# Patient Record
Sex: Female | Born: 1950 | Race: White | Hispanic: No | Marital: Married | Smoking: Current every day smoker
Health system: Southern US, Community
[De-identification: ages and names within clinical notes are randomized; demographics above are authoritative.]

## PROBLEM LIST (undated history)

## (undated) DIAGNOSIS — I10 Essential (primary) hypertension: Secondary | ICD-10-CM

## (undated) DIAGNOSIS — K5790 Diverticulosis of intestine, part unspecified, without perforation or abscess without bleeding: Secondary | ICD-10-CM

## (undated) DIAGNOSIS — R7303 Prediabetes: Secondary | ICD-10-CM

## (undated) DIAGNOSIS — Z86010 Personal history of colonic polyps: Secondary | ICD-10-CM

## (undated) DIAGNOSIS — Z780 Asymptomatic menopausal state: Secondary | ICD-10-CM

## (undated) DIAGNOSIS — M545 Low back pain, unspecified: Secondary | ICD-10-CM

## (undated) DIAGNOSIS — Z713 Dietary counseling and surveillance: Secondary | ICD-10-CM

## (undated) DIAGNOSIS — M8580 Other specified disorders of bone density and structure, unspecified site: Secondary | ICD-10-CM

## (undated) DIAGNOSIS — F17200 Nicotine dependence, unspecified, uncomplicated: Secondary | ICD-10-CM

## (undated) DIAGNOSIS — H409 Unspecified glaucoma: Secondary | ICD-10-CM

## (undated) DIAGNOSIS — K648 Other hemorrhoids: Secondary | ICD-10-CM

## (undated) DIAGNOSIS — E785 Hyperlipidemia, unspecified: Secondary | ICD-10-CM

## (undated) DIAGNOSIS — J449 Chronic obstructive pulmonary disease, unspecified: Secondary | ICD-10-CM

## (undated) HISTORY — DX: Unspecified glaucoma: H40.9

## (undated) HISTORY — DX: Other specified disorders of bone density and structure, unspecified site: M85.80

## (undated) HISTORY — DX: Low back pain: M54.5

## (undated) HISTORY — DX: Prediabetes: R73.03

## (undated) HISTORY — DX: Hyperlipidemia, unspecified: E78.5

## (undated) HISTORY — DX: Essential (primary) hypertension: I10

## (undated) HISTORY — DX: Other hemorrhoids: K64.8

## (undated) HISTORY — DX: Dietary counseling and surveillance: Z71.3

## (undated) HISTORY — DX: Low back pain, unspecified: M54.50

## (undated) HISTORY — DX: Asymptomatic menopausal state: Z78.0

## (undated) HISTORY — DX: Chronic obstructive pulmonary disease, unspecified: J44.9

## (undated) HISTORY — DX: Diverticulosis of intestine, part unspecified, without perforation or abscess without bleeding: K57.90

## (undated) HISTORY — DX: Personal history of colonic polyps: Z86.010

## (undated) HISTORY — DX: Nicotine dependence, unspecified, uncomplicated: F17.200

---
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
## 1978-09-21 HISTORY — PX: TUBAL LIGATION: SHX77

## 1995-09-22 DIAGNOSIS — E785 Hyperlipidemia, unspecified: Secondary | ICD-10-CM

## 1995-09-22 HISTORY — DX: Hyperlipidemia, unspecified: E78.5

## 1999-08-12 ENCOUNTER — Encounter: Payer: Self-pay | Admitting: Emergency Medicine

## 1999-08-12 ENCOUNTER — Encounter: Admission: RE | Admit: 1999-08-12 | Discharge: 1999-08-12 | Payer: Self-pay | Admitting: Emergency Medicine

## 1999-12-08 ENCOUNTER — Encounter: Payer: Self-pay | Admitting: Emergency Medicine

## 1999-12-08 ENCOUNTER — Encounter: Admission: RE | Admit: 1999-12-08 | Discharge: 1999-12-08 | Payer: Self-pay | Admitting: Emergency Medicine

## 2000-12-15 ENCOUNTER — Encounter: Payer: Self-pay | Admitting: Gynecology

## 2000-12-15 ENCOUNTER — Other Ambulatory Visit: Admission: RE | Admit: 2000-12-15 | Discharge: 2000-12-15 | Payer: Self-pay | Admitting: Gynecology

## 2000-12-15 ENCOUNTER — Encounter: Admission: RE | Admit: 2000-12-15 | Discharge: 2000-12-15 | Payer: Self-pay | Admitting: Gynecology

## 2000-12-29 ENCOUNTER — Encounter: Payer: Self-pay | Admitting: Gynecology

## 2000-12-29 ENCOUNTER — Encounter: Admission: RE | Admit: 2000-12-29 | Discharge: 2000-12-29 | Payer: Self-pay | Admitting: Gynecology

## 2001-09-30 ENCOUNTER — Encounter (INDEPENDENT_AMBULATORY_CARE_PROVIDER_SITE_OTHER): Payer: Self-pay | Admitting: Specialist

## 2001-09-30 ENCOUNTER — Ambulatory Visit (HOSPITAL_BASED_OUTPATIENT_CLINIC_OR_DEPARTMENT_OTHER): Admission: RE | Admit: 2001-09-30 | Discharge: 2001-09-30 | Payer: Self-pay | Admitting: Gynecology

## 2002-01-10 ENCOUNTER — Encounter: Payer: Self-pay | Admitting: Gynecology

## 2002-01-10 ENCOUNTER — Other Ambulatory Visit: Admission: RE | Admit: 2002-01-10 | Discharge: 2002-01-10 | Payer: Self-pay | Admitting: Gynecology

## 2002-01-10 ENCOUNTER — Encounter: Admission: RE | Admit: 2002-01-10 | Discharge: 2002-01-10 | Payer: Self-pay | Admitting: Gynecology

## 2003-01-15 ENCOUNTER — Other Ambulatory Visit: Admission: RE | Admit: 2003-01-15 | Discharge: 2003-01-15 | Payer: Self-pay | Admitting: Gynecology

## 2003-01-16 ENCOUNTER — Encounter: Payer: Self-pay | Admitting: Gynecology

## 2003-01-16 ENCOUNTER — Encounter: Admission: RE | Admit: 2003-01-16 | Discharge: 2003-01-16 | Payer: Self-pay | Admitting: Gynecology

## 2003-01-23 LAB — HM DEXA SCAN: HM Dexa Scan: NORMAL

## 2003-02-13 ENCOUNTER — Encounter: Payer: Self-pay | Admitting: Emergency Medicine

## 2003-02-13 ENCOUNTER — Encounter: Admission: RE | Admit: 2003-02-13 | Discharge: 2003-02-13 | Payer: Self-pay | Admitting: Emergency Medicine

## 2004-01-17 ENCOUNTER — Other Ambulatory Visit: Admission: RE | Admit: 2004-01-17 | Discharge: 2004-01-17 | Payer: Self-pay | Admitting: Gynecology

## 2004-01-17 ENCOUNTER — Encounter: Admission: RE | Admit: 2004-01-17 | Discharge: 2004-01-17 | Payer: Self-pay | Admitting: Gynecology

## 2005-01-20 ENCOUNTER — Other Ambulatory Visit: Admission: RE | Admit: 2005-01-20 | Discharge: 2005-01-20 | Payer: Self-pay | Admitting: Gynecology

## 2005-03-19 ENCOUNTER — Encounter: Admission: RE | Admit: 2005-03-19 | Discharge: 2005-03-19 | Payer: Self-pay | Admitting: Gynecology

## 2005-09-21 LAB — HM COLONOSCOPY

## 2006-02-02 ENCOUNTER — Other Ambulatory Visit: Admission: RE | Admit: 2006-02-02 | Discharge: 2006-02-02 | Payer: Self-pay | Admitting: Gynecology

## 2006-03-23 ENCOUNTER — Encounter: Admission: RE | Admit: 2006-03-23 | Discharge: 2006-03-23 | Payer: Self-pay | Admitting: Gynecology

## 2007-02-17 ENCOUNTER — Other Ambulatory Visit: Admission: RE | Admit: 2007-02-17 | Discharge: 2007-02-17 | Payer: Self-pay | Admitting: Gynecology

## 2007-03-31 ENCOUNTER — Encounter: Admission: RE | Admit: 2007-03-31 | Discharge: 2007-03-31 | Payer: Self-pay | Admitting: Gynecology

## 2008-04-03 ENCOUNTER — Encounter: Admission: RE | Admit: 2008-04-03 | Discharge: 2008-04-03 | Payer: Self-pay | Admitting: Gynecology

## 2009-04-04 ENCOUNTER — Encounter: Admission: RE | Admit: 2009-04-04 | Discharge: 2009-04-04 | Payer: Self-pay | Admitting: Gynecology

## 2010-04-21 DIAGNOSIS — M8580 Other specified disorders of bone density and structure, unspecified site: Secondary | ICD-10-CM

## 2010-04-21 HISTORY — DX: Other specified disorders of bone density and structure, unspecified site: M85.80

## 2010-05-09 ENCOUNTER — Encounter: Admission: RE | Admit: 2010-05-09 | Discharge: 2010-05-09 | Payer: Self-pay | Admitting: Gynecology

## 2010-09-21 DIAGNOSIS — Z860101 Personal history of adenomatous and serrated colon polyps: Secondary | ICD-10-CM

## 2010-09-21 DIAGNOSIS — K5790 Diverticulosis of intestine, part unspecified, without perforation or abscess without bleeding: Secondary | ICD-10-CM

## 2010-09-21 DIAGNOSIS — K648 Other hemorrhoids: Secondary | ICD-10-CM

## 2010-09-21 DIAGNOSIS — Z86010 Personal history of colonic polyps: Secondary | ICD-10-CM

## 2010-09-21 HISTORY — DX: Other hemorrhoids: K64.8

## 2010-09-21 HISTORY — DX: Personal history of colonic polyps: Z86.010

## 2010-09-21 HISTORY — DX: Personal history of adenomatous and serrated colon polyps: Z86.0101

## 2010-09-21 HISTORY — DX: Diverticulosis of intestine, part unspecified, without perforation or abscess without bleeding: K57.90

## 2010-10-12 ENCOUNTER — Encounter: Payer: Self-pay | Admitting: Gynecology

## 2011-02-06 NOTE — Op Note (Signed)
[facility]  Patient:    Bailey Parker, Bailey Parker Visit Number: [id number] MRN: [id number]          Service Type: NES Location: [facility] Attending Physician:  Katrina Stack Dictated by:   Gretta Cool, M.D. Admit Date:  09/30/2001   CC:         Reuben Likes, M.D.   Operative Report  PREOPERATIVE DIAGNOSIS:  Abnormal uterine bleeding with endometrial polyp by previous dilatation and curettage November 2002.  POSTOPERATIVE DIAGNOSIS:  Abnormal uterine bleeding with endometrial polyp by previous dilatation and curettage November 2002.  OPERATION:  Hysteroscopy, total endometrial resection, and ablation by VaporTrode.  SURGEON:  Gretta Cool, M.D.  ASSISTANT:  IV sedation, paracervical block.  DESCRIPTION OF PROCEDURE:  Under excellent anesthesia as above, with the patients perineum prepped and draped in the lithotomy position, the cervix was progressively dilated to 33 Pratt to accommodate a 7 mm resectoscope.  The resectoscope was then introduced and the tubal ostia identified.  There was no significant residual endometrial polyp present.  There was thickened endometrial tissue.  The entire endometrial cavity was then systematically resected 360 degrees around the cavity.  The cornual area was also resected. Once the entire endometrial cavity was resected down 3 mm or more into myometrium, the endometrial cavity was treated by VaporTrode electrode at 200-watt setting pure cut.  At reduced pressure there was no significant bleeding from any site.  At the end of the procedure the sponge and lap counts were correct.  There were no complications.  The patient returned to the recovery room in excellent condition. Dictated by:   Gretta Cool, M.D. Attending Physician:  Katrina Stack DD:  09/30/01 TD:  10/01/01 Job: [phone] [id number]

## 2011-03-22 HISTORY — PX: COLONOSCOPY: SHX174

## 2011-04-08 ENCOUNTER — Other Ambulatory Visit: Payer: Self-pay | Admitting: Gynecology

## 2011-04-08 DIAGNOSIS — Z1231 Encounter for screening mammogram for malignant neoplasm of breast: Secondary | ICD-10-CM

## 2011-04-16 LAB — HM COLONOSCOPY

## 2011-05-07 ENCOUNTER — Other Ambulatory Visit: Payer: Self-pay | Admitting: Gynecology

## 2011-05-12 ENCOUNTER — Ambulatory Visit
Admission: RE | Admit: 2011-05-12 | Discharge: 2011-05-12 | Disposition: A | Discharge status: Home / Self Care | Payer: BC Managed Care – PPO | Source: Ambulatory Visit | Attending: Gynecology | Admitting: Gynecology

## 2011-05-12 DIAGNOSIS — Z1231 Encounter for screening mammogram for malignant neoplasm of breast: Secondary | ICD-10-CM

## 2011-05-23 LAB — HM PAP SMEAR: HM Pap smear: NEGATIVE

## 2011-06-22 HISTORY — PX: BAND HEMORRHOIDECTOMY: SHX1213

## 2011-07-02 ENCOUNTER — Ambulatory Visit
Admission: RE | Admit: 2011-07-02 | Discharge: 2011-07-02 | Disposition: A | Discharge status: Home / Self Care | Payer: BC Managed Care – PPO | Source: Ambulatory Visit | Attending: Occupational Medicine | Admitting: Occupational Medicine

## 2011-07-02 ENCOUNTER — Ambulatory Visit
Admission: RE | Admit: 2011-07-02 | Discharge: 2011-07-02 | Disposition: A | Discharge status: Home / Self Care | Payer: No Typology Code available for payment source | Source: Ambulatory Visit | Attending: Occupational Medicine | Admitting: Occupational Medicine

## 2011-07-02 ENCOUNTER — Other Ambulatory Visit: Payer: Self-pay | Admitting: Occupational Medicine

## 2011-07-02 DIAGNOSIS — R52 Pain, unspecified: Secondary | ICD-10-CM

## 2011-07-03 ENCOUNTER — Other Ambulatory Visit: Payer: Self-pay | Admitting: Emergency Medicine

## 2011-07-03 ENCOUNTER — Ambulatory Visit
Admission: RE | Admit: 2011-07-03 | Discharge: 2011-07-03 | Disposition: A | Discharge status: Home / Self Care | Payer: BC Managed Care – PPO | Source: Ambulatory Visit | Attending: Emergency Medicine | Admitting: Emergency Medicine

## 2011-07-03 DIAGNOSIS — M545 Low back pain: Secondary | ICD-10-CM

## 2012-01-28 ENCOUNTER — Encounter: Payer: Self-pay | Admitting: *Deleted

## 2012-03-10 ENCOUNTER — Institutional Professional Consult (permissible substitution): Payer: BC Managed Care – PPO | Admitting: Family Medicine

## 2012-03-17 ENCOUNTER — Ambulatory Visit (INDEPENDENT_AMBULATORY_CARE_PROVIDER_SITE_OTHER): Payer: BC Managed Care – PPO | Admitting: Family Medicine

## 2012-03-17 ENCOUNTER — Encounter: Payer: Self-pay | Admitting: Family Medicine

## 2012-03-17 VITALS — BP 142/92 | HR 80 | Ht 61.0 in | Wt 129.0 lb

## 2012-03-17 DIAGNOSIS — E7800 Pure hypercholesterolemia, unspecified: Secondary | ICD-10-CM

## 2012-03-17 DIAGNOSIS — M8580 Other specified disorders of bone density and structure, unspecified site: Secondary | ICD-10-CM

## 2012-03-17 DIAGNOSIS — M109 Gout, unspecified: Secondary | ICD-10-CM

## 2012-03-17 DIAGNOSIS — M949 Disorder of cartilage, unspecified: Secondary | ICD-10-CM

## 2012-03-17 DIAGNOSIS — R7303 Prediabetes: Secondary | ICD-10-CM

## 2012-03-17 DIAGNOSIS — E559 Vitamin D deficiency, unspecified: Secondary | ICD-10-CM

## 2012-03-17 DIAGNOSIS — I10 Essential (primary) hypertension: Secondary | ICD-10-CM

## 2012-03-17 DIAGNOSIS — R7309 Other abnormal glucose: Secondary | ICD-10-CM

## 2012-03-17 DIAGNOSIS — D649 Anemia, unspecified: Secondary | ICD-10-CM

## 2012-03-17 NOTE — Patient Instructions (Addendum)
Check with insurance regarding coverage for shingles vaccine (zostavax).  You can schedule a nurse visit for this, if desired, or get at your next physical.  Pneumonia vaccine is also recommended, due to your smoking, and possible COPD (per Dr. Melanee Spry notes).  This can also be given at nurse visit or physical.  Yearly flu shots (in the fall) are also recommended.  Please try and quit smoking.  1-800-QUITLINE or [place]Quitline.[place] offer free counseling.  [facility] also offers free smoking cessation classes  2 Gram Low Sodium Diet A 2 gram sodium diet restricts the amount of sodium in the diet to no more than 2 g or 2000 mg daily. Limiting the amount of sodium is often used to help lower blood pressure. It is important if you have heart, liver, or kidney problems. Many foods contain sodium for flavor and sometimes as a preservative. When the amount of sodium in a diet needs to be low, it is important to know what to look for when choosing foods and drinks. The following includes some information and guidelines to help make it easier for you to adapt to a low sodium diet. QUICK TIPS  Do not add salt to food.   Avoid convenience items and fast food.   Choose unsalted snack foods.   Buy lower sodium products, often labeled as "lower sodium" or "no salt added."   Check food labels to learn how much sodium is in 1 serving.   When eating at a restaurant, ask that your food be prepared with less salt or none, if possible.  READING FOOD LABELS FOR SODIUM INFORMATION The nutrition facts label is a good place to find how much sodium is in foods. Look for products with no more than 500 to 600 mg of sodium per meal and no more than 150 mg per serving. Remember that 2 g = 2000 mg. The food label may also list foods as:  Sodium-free: Less than 5 mg in a serving.   Very low sodium: 35 mg or less in a serving.   Low-sodium: 140 mg or less in a serving.   Light in sodium: 50%  less sodium in a serving. For example, if a food that usually has 300 mg of sodium is changed to become light in sodium, it will have 150 mg of sodium.   Reduced sodium: 25% less sodium in a serving. For example, if a food that usually has 400 mg of sodium is changed to reduced sodium, it will have 300 mg of sodium.  CHOOSING FOODS Grains  Avoid: Salted crackers and snack items. Some cereals, including instant hot cereals. Bread stuffing and biscuit mixes. Seasoned rice or pasta mixes.   Choose: Unsalted snack items. Low-sodium cereals, oats, puffed wheat and rice, shredded wheat. English muffins and bread. Pasta.  Meats  Avoid: Salted, canned, smoked, spiced, pickled meats, including fish and poultry. Bacon, ham, sausage, cold cuts, hot dogs, anchovies.   Choose: Low-sodium canned tuna and salmon. Fresh or frozen meat, poultry, and fish.  Dairy  Avoid: Processed cheese and spreads. Cottage cheese. Buttermilk and condensed milk. Regular cheese.   Choose: Milk. Low-sodium cottage cheese. Yogurt. Sour cream. Low-sodium cheese.  Fruits and Vegetables  Avoid: Regular canned vegetables. Regular canned tomato sauce and paste. Frozen vegetables in sauces. Olives. Rosita Fire. Relishes. Sauerkraut.   Choose: Low-sodium canned vegetables. Low-sodium tomato sauce and paste. Frozen or fresh vegetables. Fresh and frozen fruit.  Condiments  Avoid: Canned and packaged gravies. Worcestershire sauce.  Tartar sauce. Barbecue sauce. Soy sauce. Steak sauce. Ketchup. Onion, garlic, and table salt. Meat flavorings and tenderizers.   Choose: Fresh and dried herbs and spices. Low-sodium varieties of mustard and ketchup. Lemon juice. Tabasco sauce. Horseradish.  SAMPLE 2 GRAM SODIUM MEAL PLAN Breakfast / Sodium (mg)  1 cup low-fat milk / 143 mg   2 slices whole-wheat toast / 270 mg   1 tbs heart-healthy margarine / 153 mg   1 hard-boiled egg / 139 mg   1 small orange / 0 mg  Lunch / Sodium (mg)  1 cup  raw carrots / 76 mg    cup hummus / 298 mg   1 cup low-fat milk / 143 mg    cup red grapes / 2 mg   1 whole-wheat pita bread / 356 mg  Dinner / Sodium (mg)  1 cup whole-wheat pasta / 2 mg   1 cup low-sodium tomato sauce / 73 mg   3 oz lean ground beef / 57 mg   1 small side salad (1 cup raw spinach leaves,  cup cucumber,  cup yellow bell pepper) with 1 tsp olive oil and 1 tsp red wine vinegar / 25 mg  Snack / Sodium (mg)  1 container low-fat vanilla yogurt / 107 mg   3 graham cracker squares / 127 mg  Nutrient Analysis  Calories: 2033   Protein: 77 g   Carbohydrate: 282 g   Fat: 72 g   Sodium: 1971 mg  Document Released: 09/07/2005 Document Revised: 08/27/2011 Document Reviewed: 12/09/2009 [facility] Patient Information 2012 [place], [place].

## 2012-03-17 NOTE — Progress Notes (Signed)
Chief Complaint  Patient presents with  . Establish Care    new patient to establish care.   HPI: Patient presents to establish care, formerly a patient of Dr. Melanee Spry, whose records have been received.  HTN: Doesn't check BP elsewhere.  Has monitor, but unsure how to use it.  Reports BP's usually run around 140 when checked at doctor.  Last check in January at Dr. Melanee Spry office was 130/82.  Denies muscle cramps, headaches, dizziness, leg swelling.  Hyperlipidemia follow-up:  Patient is reportedly following a low-fat, low cholesterol diet.  Compliant with medications and denies medication side effects  Pre-diabetes:  A1c in January was 5.7. She denies polydipsia or polyuria.  She doesn't check blood sugars. TSH also done in 09/2011 (normal at 1.488) due to some weight gain. Hasn't had any further weight gain, or other thyroid complaints.  Labs done at her last CPE in 05/2011: CBC:  hgb 11.7 c-met normal; fglu 88 A1c 5.9 Normal TSH and myeloperoxidase Chol 143; LDL 80, HDL 49, TG 75 Vitamin D-OH 27.6  Past Medical History  Diagnosis Date  . COPD (chronic obstructive pulmonary disease)   . Gouty arthropathy     R foot  . LBP (low back pain)   . Dietary surveillance and counseling   . Prediabetes   . Vitamin d deficiency   . Hypertension     controlled  . Hyperlipidemia 1997  . Glaucoma     Dr. Charlotte Sanes  . Hx of adenomatous colonic polyps 2012    Dr. Kinnie Scales  . Internal hemorrhoids 2012  . Diverticulosis 2012  . Postmenopausal     sees Dr. Nicholas Lose for GYN care  . Osteopenia 04/2010    (DEXA ordered by Dr. Nicholas Lose, scheduled for 04/2012)    Past Surgical History  Procedure Date  . Appendectomy 1966  . Tubal ligation 1980  . Colonoscopy 03/2011    Dr. Kinnie Scales  . Band hemorrhoidectomy 06/2011    Dr. Kinnie Scales    History   Social History  . Marital Status: Married    Spouse Name: N/A    Number of Children: 2  . Years of Education: N/A   Occupational History  . retired  Futures trader)    Social History Main Topics  . Smoking status: Current Everyday Smoker -- 0.5 packs/day for 40 years    Types: Cigarettes  . Smokeless tobacco: Never Used  . Alcohol Use: Yes     2 drinks per week.  . Drug Use: No  . Sexually Active: Yes -- Female partner(s)   Other Topics Concern  . Not on file   Social History Narrative   Lives with husband.  1 daughter in [place], the other daughter lives down the street.    Family History  Problem Relation Age of Onset  . Alzheimer's disease Mother   . Hypertension Mother   . Heart disease Father   . Diabetes Father   . Hypertension Brother   . Diabetes Brother   . Cancer Neg Hx    Immunization History  Administered Date(s) Administered  . Td 08/03/1995  . Tdap 02/02/2006    Current outpatient prescriptions:allopurinol (ZYLOPRIM) 300 MG tablet, Take 300 mg by mouth daily., Disp: , Rfl: ;  Calcium Carbonate-Vitamin D (CALCIUM 600+D) 600-400 MG-UNIT per tablet, Take 1 tablet by mouth daily., Disp: , Rfl: ;  dorzolamide-timolol (COSOPT) 22.3-6.8 MG/ML ophthalmic solution, Place 1 drop into both eyes 2 (two) times daily., Disp: , Rfl:  estradiol (VAGIFEM) 25 MCG vaginal tablet,  Place 25 mcg vaginally once a week., Disp: , Rfl: ;  indapamide (LOZOL) 2.5 MG tablet, Take 2.5 mg by mouth every morning., Disp: , Rfl: ;  potassium bicarbonate (K-LYTE) 25 MEQ disintegrating tablet, Take 25 mEq by mouth daily., Disp: , Rfl: ;  simvastatin (ZOCOR) 40 MG tablet, Take 40 mg by mouth every evening., Disp: , Rfl:   No Known Allergies  ROS: Denies chest pain, palpitations, dizziness, syncope. Denies nausea, vomiting, heartburn, bowel changes, urinary complaints, joint pains, skin concerns, bleeding/bruising, denies depression/anxiety, insomnia.  PHYSICAL EXAM: BP 142/92  Pulse 80  Ht 5\' 1"  (1.549 m)  Wt 129 lb (58.514 kg)  BMI 24.37 kg/m2 Repeat BP 140/90 RA by [place] Well developed, pleasant female in no distress HEENT:  PERRL, Conjunctiva  clear, TM's normal.  OP clear Neck: no lymphadenopathy, thyromegaly or mass Heart: regular rate and rhythm without murmur Lungs: clear bilaterally Back: no spine or CVA tenderness Abdomen: soft, nontender, no organomegaly or mass Extremities: no edema, 2+ pulse Skin: no rash or lesions Psych: normal mood, affect, hygiene and grooming Neuro: alert and oriented.  Normal strength, gait  ASSESSMENT/PLAN: 1. Pure hypercholesterolemia  Comprehensive metabolic panel, Lipid panel  2. Essential hypertension, benign  Comprehensive metabolic panel  3. Gouty arthropathy  Uric Acid  4. Osteopenia    5. Unspecified vitamin D deficiency  Vitamin D 25 hydroxy  6. Prediabetes  Hemoglobin A1c, Comprehensive metabolic panel  7. Anemia  CBC with Differential   HTN--BP elevated today.  Reviewed low sodium diet, encouraged daily exercise.  Recommended monitoring BP elsewhere.  Schedule nurse visit--bring BP monitor to be shown how to use and see if accurate  Shingles vaccine, yearly flu shot and pneumovax were recommended.  Check with insurance regarding zostavax, and can either do at nurse visit or at CPE (which is due in September). Risks and benefits of these vaccines were reviewed with patient.  Gout--doing well on preventative meds.  Return in September for CPE with fasting labs prior (c-met, lipids, Vitamin D, CBC (mild anemia), uric acid), sooner prn, especially if BP's running high at home.

## 2012-04-07 ENCOUNTER — Other Ambulatory Visit: Payer: Self-pay | Admitting: Gynecology

## 2012-04-07 DIAGNOSIS — Z1231 Encounter for screening mammogram for malignant neoplasm of breast: Secondary | ICD-10-CM

## 2012-05-10 ENCOUNTER — Other Ambulatory Visit: Payer: Self-pay | Admitting: Gynecology

## 2012-05-12 ENCOUNTER — Ambulatory Visit
Admission: RE | Admit: 2012-05-12 | Discharge: 2012-05-12 | Disposition: A | Discharge status: Home / Self Care | Payer: BC Managed Care – PPO | Source: Ambulatory Visit | Attending: Gynecology | Admitting: Gynecology

## 2012-05-12 DIAGNOSIS — Z1231 Encounter for screening mammogram for malignant neoplasm of breast: Secondary | ICD-10-CM

## 2012-05-16 ENCOUNTER — Other Ambulatory Visit: Payer: Self-pay | Admitting: Gynecology

## 2012-05-16 DIAGNOSIS — R928 Other abnormal and inconclusive findings on diagnostic imaging of breast: Secondary | ICD-10-CM

## 2012-05-17 ENCOUNTER — Ambulatory Visit
Admission: RE | Admit: 2012-05-17 | Discharge: 2012-05-17 | Disposition: A | Discharge status: Home / Self Care | Payer: BC Managed Care – PPO | Source: Ambulatory Visit | Attending: Gynecology | Admitting: Gynecology

## 2012-05-17 DIAGNOSIS — R928 Other abnormal and inconclusive findings on diagnostic imaging of breast: Secondary | ICD-10-CM

## 2012-06-28 ENCOUNTER — Other Ambulatory Visit: Payer: BC Managed Care – PPO

## 2012-06-28 DIAGNOSIS — E7800 Pure hypercholesterolemia, unspecified: Secondary | ICD-10-CM

## 2012-06-28 DIAGNOSIS — R7303 Prediabetes: Secondary | ICD-10-CM

## 2012-06-28 DIAGNOSIS — I10 Essential (primary) hypertension: Secondary | ICD-10-CM

## 2012-06-28 DIAGNOSIS — D649 Anemia, unspecified: Secondary | ICD-10-CM

## 2012-06-28 DIAGNOSIS — E559 Vitamin D deficiency, unspecified: Secondary | ICD-10-CM

## 2012-06-28 DIAGNOSIS — M109 Gout, unspecified: Secondary | ICD-10-CM

## 2012-06-28 LAB — COMPREHENSIVE METABOLIC PANEL
Alkaline Phosphatase: 65 U/L (ref 39–117)
CO2: 32 mEq/L (ref 19–32)
Creat: 0.61 mg/dL (ref 0.50–1.10)
Glucose, Bld: 73 mg/dL (ref 70–99)
Total Bilirubin: 0.4 mg/dL (ref 0.3–1.2)

## 2012-06-28 LAB — CBC WITH DIFFERENTIAL/PLATELET
Basophils Relative: 0 % (ref 0–1)
Eosinophils Absolute: 0.1 10*3/uL (ref 0.0–0.7)
Eosinophils Relative: 2 % (ref 0–5)
Hemoglobin: 12.1 g/dL (ref 12.0–15.0)
Lymphs Abs: 1.2 10*3/uL (ref 0.7–4.0)
MCH: 33 pg (ref 26.0–34.0)
MCHC: 34.3 g/dL (ref 30.0–36.0)
MCV: 96.2 fL (ref 78.0–100.0)
Monocytes Relative: 9 % (ref 3–12)
Neutrophils Relative %: 60 % (ref 43–77)
Platelets: 244 10*3/uL (ref 150–400)
RBC: 3.67 MIL/uL — ABNORMAL LOW (ref 3.87–5.11)

## 2012-06-28 LAB — LIPID PANEL
Cholesterol: 148 mg/dL (ref 0–200)
HDL: 62 mg/dL (ref >39)
Total CHOL/HDL Ratio: 2.4 Ratio
Triglycerides: 63 mg/dL (ref <150)
VLDL: 13 mg/dL (ref 0–40)

## 2012-06-28 LAB — HEMOGLOBIN A1C: Hgb A1c MFr Bld: 5.7 % — ABNORMAL HIGH (ref <5.7)

## 2012-06-29 LAB — VITAMIN D 25 HYDROXY (VIT D DEFICIENCY, FRACTURES): Vit D, 25-Hydroxy: 18 ng/mL — ABNORMAL LOW (ref 30–89)

## 2012-06-30 ENCOUNTER — Encounter: Payer: Self-pay | Admitting: Family Medicine

## 2012-06-30 ENCOUNTER — Ambulatory Visit (INDEPENDENT_AMBULATORY_CARE_PROVIDER_SITE_OTHER): Payer: BC Managed Care – PPO | Admitting: Family Medicine

## 2012-06-30 VITALS — BP 154/90 | HR 68 | Ht 61.0 in | Wt 128.0 lb

## 2012-06-30 DIAGNOSIS — R7309 Other abnormal glucose: Secondary | ICD-10-CM

## 2012-06-30 DIAGNOSIS — R7989 Other specified abnormal findings of blood chemistry: Secondary | ICD-10-CM

## 2012-06-30 DIAGNOSIS — M899 Disorder of bone, unspecified: Secondary | ICD-10-CM

## 2012-06-30 DIAGNOSIS — E7800 Pure hypercholesterolemia, unspecified: Secondary | ICD-10-CM

## 2012-06-30 DIAGNOSIS — E559 Vitamin D deficiency, unspecified: Secondary | ICD-10-CM

## 2012-06-30 DIAGNOSIS — M8580 Other specified disorders of bone density and structure, unspecified site: Secondary | ICD-10-CM

## 2012-06-30 DIAGNOSIS — M109 Gout, unspecified: Secondary | ICD-10-CM

## 2012-06-30 DIAGNOSIS — I10 Essential (primary) hypertension: Secondary | ICD-10-CM

## 2012-06-30 DIAGNOSIS — Z0000 Encounter for general adult medical examination without abnormal findings: Secondary | ICD-10-CM

## 2012-06-30 DIAGNOSIS — R7303 Prediabetes: Secondary | ICD-10-CM

## 2012-06-30 LAB — POCT URINALYSIS DIPSTICK
Bilirubin, UA: NEGATIVE
Glucose, UA: NEGATIVE
Ketones, UA: NEGATIVE
Spec Grav, UA: <=1.005
Urobilinogen, UA: NEGATIVE

## 2012-06-30 MED ORDER — VITAMIN D (ERGOCALCIFEROL) 1.25 MG (50000 UNIT) PO CAPS: 50000.0000 [IU] | ORAL_CAPSULE | ORAL | Status: DC

## 2012-06-30 NOTE — Patient Instructions (Addendum)
HEALTH MAINTENANCE RECOMMENDATIONS:  It is recommended that you get at least 30 minutes of aerobic exercise at least 5 days/week (for weight loss, you may need as much as 60-90 minutes). This can be any activity that gets your heart rate up. This can be divided in 10-15 minute intervals if needed, but try and build up your endurance at least once a week.  Weight bearing exercise is also recommended twice weekly.  Eat a healthy diet with lots of vegetables, fruits and fiber.  "Colorful" foods have a lot of vitamins (ie green vegetables, tomatoes, red peppers, etc).  Limit sweet tea, regular sodas and alcoholic beverages, all of which has a lot of calories and sugar.  Up to 1 alcoholic drink daily may be beneficial for women (unless trying to lose weight, watch sugars).  Drink a lot of water.  Calcium recommendations are 1200-1500 mg daily (1500 mg for postmenopausal women or women without ovaries), and vitamin D 1000 IU daily.  This should be obtained from diet and/or supplements (vitamins), and calcium should not be taken all at once, but in divided doses.  Monthly self breast exams and yearly mammograms for women over the age of 48 is recommended.  Sunscreen of at least SPF 30 should be used on all sun-exposed parts of the skin when outside between the hours of 10 am and 4 pm (not just when at beach or pool, but even with exercise, golf, tennis, and yard work!)  Use a sunscreen that says "broad spectrum" so it covers both UVA and UVB rays, and make sure to reapply every 1-2 hours.  Remember to change the batteries in your smoke detectors when changing your clock times in the spring and fall.  Use your seat belt every time you are in a car, and please drive safely and not be distracted with cell phones and texting while driving.  Please quit smoking.  Please check with your insurance regarding shingles vaccine coverage (zostavax).  Make a nurse visit if it is covered and you are interested in  getting the vaccine.  I also recommend that you get a flu shot and a pneumonia vaccine.   Lab Results  Component Value Date   HGBA1C 5.7* 06/28/2012     Chemistry      Component Value Date/Time   NA 140 06/28/2012 1001   K 3.8 06/28/2012 1001   CL 99 06/28/2012 1001   CO2 32 06/28/2012 1001   BUN 8 06/28/2012 1001   CREATININE 0.61 06/28/2012 1001      Component Value Date/Time   CALCIUM 10.0 06/28/2012 1001   ALKPHOS 65 06/28/2012 1001   AST 46* 06/28/2012 1001   ALT 22 06/28/2012 1001   BILITOT 0.4 06/28/2012 1001     Glucose 73 (normal <100)  Lab Results  Component Value Date   WBC 4.1 06/28/2012   HGB 12.1 06/28/2012   HCT 35.3* 06/28/2012   MCV 96.2 06/28/2012   PLT 244 06/28/2012   Vitamin D-OH 18 (normal is >30) Uric acid 1.9 (normal 2.4-7)  Lab Results  Component Value Date   CHOL 148 06/28/2012   HDL 62 06/28/2012   LDLCALC 73 06/28/2012   TRIG 63 06/28/2012   CHOLHDL 2.4 06/28/2012   You will be starting prescription vitamin D, taken once weekly for 12 weeks.   Once you finish the 12 weeks of the prescription, I'd like for you to take an additional 1000 IU of Vitamin D3 (this is over the counter, and you  can take this in addition to the calcium supplement you are currently taking.)  Your blood pressure was a little high today.  Goal is <135/85. Follow a low sodium diet, get regular exercise, and periodically check your blood pressure elsewhere.  Bring your list of BP's to your next visit.  But if it is always >140/90, don't wait--return sooner to have your medication adjusted.  2 Gram Low Sodium Diet A 2 gram sodium diet restricts the amount of sodium in the diet to no more than 2 g or 2000 mg daily. Limiting the amount of sodium is often used to help lower blood pressure. It is important if you have heart, liver, or kidney problems. Many foods contain sodium for flavor and sometimes as a preservative. When the amount of sodium in a diet needs to be low, it is important to  know what to look for when choosing foods and drinks. The following includes some information and guidelines to help make it easier for you to adapt to a low sodium diet. QUICK TIPS  Do not add salt to food.  Avoid convenience items and fast food.  Choose unsalted snack foods.  Buy lower sodium products, often labeled as "lower sodium" or "no salt added."  Check food labels to learn how much sodium is in 1 serving.  When eating at a restaurant, ask that your food be prepared with less salt or none, if possible. READING FOOD LABELS FOR SODIUM INFORMATION The nutrition facts label is a good place to find how much sodium is in foods. Look for products with no more than 500 to 600 mg of sodium per meal and no more than 150 mg per serving. Remember that 2 g = 2000 mg. The food label may also list foods as:  Sodium-free: Less than 5 mg in a serving.  Very low sodium: 35 mg or less in a serving.  Low-sodium: 140 mg or less in a serving.  Light in sodium: 50% less sodium in a serving. For example, if a food that usually has 300 mg of sodium is changed to become light in sodium, it will have 150 mg of sodium.  Reduced sodium: 25% less sodium in a serving. For example, if a food that usually has 400 mg of sodium is changed to reduced sodium, it will have 300 mg of sodium. CHOOSING FOODS Grains  Avoid: Salted crackers and snack items. Some cereals, including instant hot cereals. Bread stuffing and biscuit mixes. Seasoned rice or pasta mixes.  Choose: Unsalted snack items. Low-sodium cereals, oats, puffed wheat and rice, shredded wheat. English muffins and bread. Pasta. Meats  Avoid: Salted, canned, smoked, spiced, pickled meats, including fish and poultry. Bacon, ham, sausage, cold cuts, hot dogs, anchovies.  Choose: Low-sodium canned tuna and salmon. Fresh or frozen meat, poultry, and fish. Dairy  Avoid: Processed cheese and spreads. Cottage cheese. Buttermilk and condensed milk.  Regular cheese.  Choose: Milk. Low-sodium cottage cheese. Yogurt. Sour cream. Low-sodium cheese. Fruits and Vegetables  Avoid: Regular canned vegetables. Regular canned tomato sauce and paste. Frozen vegetables in sauces. Olives. Rosita Fire. Relishes. Sauerkraut.  Choose: Low-sodium canned vegetables. Low-sodium tomato sauce and paste. Frozen or fresh vegetables. Fresh and frozen fruit. Condiments  Avoid: Canned and packaged gravies. Worcestershire sauce. Tartar sauce. Barbecue sauce. Soy sauce. Steak sauce. Ketchup. Onion, garlic, and table salt. Meat flavorings and tenderizers.  Choose: Fresh and dried herbs and spices. Low-sodium varieties of mustard and ketchup. Lemon juice. Tabasco sauce. Horseradish. SAMPLE 2 GRAM SODIUM  MEAL PLAN Breakfast / Sodium (mg)  1 cup low-fat milk / 143 mg  2 slices whole-wheat toast / 270 mg  1 tbs heart-healthy margarine / 153 mg  1 hard-boiled egg / 139 mg  1 small orange / 0 mg Lunch / Sodium (mg)  1 cup raw carrots / 76 mg   cup hummus / 298 mg  1 cup low-fat milk / 143 mg   cup red grapes / 2 mg  1 whole-wheat pita bread / 356 mg Dinner / Sodium (mg)  1 cup whole-wheat pasta / 2 mg  1 cup low-sodium tomato sauce / 73 mg  3 oz lean ground beef / 57 mg  1 small side salad (1 cup raw spinach leaves,  cup cucumber,  cup yellow bell pepper) with 1 tsp olive oil and 1 tsp red wine vinegar / 25 mg Snack / Sodium (mg)  1 container low-fat vanilla yogurt / 107 mg  3 graham cracker squares / 127 mg Nutrient Analysis  Calories: 2033  Protein: 77 g  Carbohydrate: 282 g  Fat: 72 g  Sodium: 1971 mg Document Released: 09/07/2005 Document Revised: 11/30/2011 Document Reviewed: 12/09/2009 [facility] Patient Information 2013 [place], [place].

## 2012-06-30 NOTE — Progress Notes (Signed)
Chief Complaint  Patient presents with  . Annual Exam    non fasting annual exam, labs done here 06/28/12. No gyn with CPE, sees Dr.Lomax and is up to date. (see quick abstraction). Pt declines flu vaccine today. Did not do eye exam as she just had one and goes routinely twice a year every year.   Bailey Parker is a 61 y.o. female who presents for a complete physical.  She is also here for follow-up on her chronic medical problems, and follow-up of recent labs.  HTN--BP's have been running 140/70's when checked at foot doctor.  She was in pain when seeing the podiatrist--doing better since wearing pads between her toes and s/p steroid shot.  Denies headaches, dizziness, chest pain, palpitations, edema or side effects from medication.  No muscle cramps.  Hyperlipidemia follow-up:  Patient is reportedly following a low-fat, low cholesterol diet.  Compliant with medications and denies medication side effects  Gout--hasn't had any gout flare since being on the allopurinol.  Health Maintenance: Immunization History  Administered Date(s) Administered  . Td 08/03/1995  . Tdap 02/02/2006   Last Pap smear: 05/12/12 Last mammogram: 05/17/12 Last colonoscopy: 04/16/2011 Last DEXA: 04/2010 (through GYN) Dentist: twice yearly Ophtho: twice yearly Exercise: "not much"  Past Medical History  Diagnosis Date  . COPD (chronic obstructive pulmonary disease)   . Gouty arthropathy     R foot  . LBP (low back pain)   . Dietary surveillance and counseling   . Prediabetes   . Vitamin D deficiency   . Hypertension     controlled  . Hyperlipidemia 1997  . Glaucoma     Dr. Charlotte Sanes  . Hx of adenomatous colonic polyps 2012    Dr. Kinnie Scales  . Internal hemorrhoids 2012  . Diverticulosis 2012  . Postmenopausal     sees Dr. Nicholas Lose for GYN care  . Osteopenia 04/2010    (DEXA ordered by Dr. Nicholas Lose, scheduled for 04/2012)  . Smoker     Past Surgical History  Procedure Date  . Appendectomy 1966  . Tubal  ligation 1980  . Colonoscopy 03/2011    Dr. Kinnie Scales  . Band hemorrhoidectomy 06/2011    Dr. Kinnie Scales    History   Social History  . Marital Status: Married    Spouse Name: N/A    Number of Children: 2  . Years of Education: N/A   Occupational History  . retired Futures trader)    Social History Main Topics  . Smoking status: Current Every Day Smoker -- 0.5 packs/day for 40 years    Types: Cigarettes  . Smokeless tobacco: Never Used  . Alcohol Use: Yes     2 drinks per week.  . Drug Use: No  . Sexually Active: Yes -- Female partner(s)   Other Topics Concern  . Not on file   Social History Narrative   Lives with husband.  1 daughter in [place], the other daughter lives down the street.    Family History  Problem Relation Age of Onset  . Alzheimer's disease Mother   . Hypertension Mother   . Heart disease Father   . Diabetes Father   . Hypertension Brother   . Diabetes Brother   . Cancer Neg Hx     Current outpatient prescriptions:allopurinol (ZYLOPRIM) 300 MG tablet, Take 300 mg by mouth daily., Disp: , Rfl: ;  Calcium Carbonate-Vitamin D (CALCIUM 600+D) 600-400 MG-UNIT per tablet, Take 1 tablet by mouth daily., Disp: , Rfl: ;  dorzolamide-timolol (COSOPT)  22.3-6.8 MG/ML ophthalmic solution, Place 1 drop into both eyes 2 (two) times daily., Disp: , Rfl:  estradiol (VAGIFEM) 25 MCG vaginal tablet, Place 25 mcg vaginally once a week., Disp: , Rfl: ;  indapamide (LOZOL) 2.5 MG tablet, Take 2.5 mg by mouth every morning., Disp: , Rfl: ;  potassium chloride 20 MEQ/15ML (10%) solution, Take 20 mEq by mouth daily., Disp: , Rfl: ;  simvastatin (ZOCOR) 40 MG tablet, Take 40 mg by mouth every evening., Disp: , Rfl:   No Known Allergies  ROS:  The patient denies anorexia, fever, weight changes, headaches,  vision changes, decreased hearing, ear pain, sore throat, breast concerns, chest pain, palpitations, dizziness, syncope, dyspnea on exertion, cough, swelling, nausea, vomiting, diarrhea,  constipation, abdominal pain, melena, hematochezia, indigestion/heartburn, hematuria, incontinence, dysuria, vaginal bleeding, discharge, odor or itch, genital lesions, joint pains, numbness, tingling, weakness, tremor, suspicious skin lesions, depression, anxiety, abnormal bleeding/bruising, or enlarged lymph nodes.  PHYSICAL EXAM: BP 140/76  Pulse 68  Ht 5\' 1"  (1.549 m)  Wt 128 lb (58.06 kg)  BMI 24.19 kg/m2 154/90 on repeat by MD, RA General Appearance:    Alert, cooperative, no distress, appears stated age  Head:    Normocephalic, without obvious abnormality, atraumatic  Eyes:    PERRL, conjunctiva/corneas clear, EOM's intact, fundi    benign  Ears:    Normal TM's and external ear canals  Nose:   Nares normal, mucosa normal, no drainage or sinus   tenderness  Throat:   Lips, mucosa, and tongue normal; teeth and gums normal  Neck:   Supple, no lymphadenopathy;  thyroid:  no   enlargement/tenderness/nodules; no carotid   bruit or JVD  Back:    Spine nontender, no curvature, ROM normal, no CVA     tenderness  Lungs:     Clear to auscultation bilaterally without wheezes, rales or     ronchi; respirations unlabored  Chest Wall:    No tenderness or deformity   Heart:    Regular rate and rhythm, S1 and S2 normal, no murmur, rub   or gallop  Breast Exam:    Deferred to GYN  Abdomen:     Soft, non-tender, nondistended, normoactive bowel sounds,    no masses, no hepatosplenomegaly  Genitalia:    Deferred to GYN     Extremities:   No clubbing, cyanosis or edema  Pulses:   2+ and symmetric all extremities  Skin:   Skin color, texture, turgor normal, no rashes or lesions  Lymph nodes:   Cervical, supraclavicular, and axillary nodes normal  Neurologic:   CNII-XII intact, normal strength, sensation and gait; reflexes 2+ and symmetric throughout          Psych:   Normal mood, affect, hygiene and grooming.    Normal urine dip today.  Lab Results  Component Value Date   HGBA1C 5.7* 06/28/2012      Chemistry      Component Value Date/Time   NA 140 06/28/2012 1001   K 3.8 06/28/2012 1001   CL 99 06/28/2012 1001   CO2 32 06/28/2012 1001   BUN 8 06/28/2012 1001   CREATININE 0.61 06/28/2012 1001      Component Value Date/Time   CALCIUM 10.0 06/28/2012 1001   ALKPHOS 65 06/28/2012 1001   AST 46* 06/28/2012 1001   ALT 22 06/28/2012 1001   BILITOT 0.4 06/28/2012 1001     Glucose 73 (normal <100)  Lab Results  Component Value Date   WBC 4.1 06/28/2012  HGB 12.1 06/28/2012   HCT 35.3* 06/28/2012   MCV 96.2 06/28/2012   PLT 244 06/28/2012   Vitamin D-OH 18 (normal is >30) Uric acid 1.9 (normal 2.4-7)  Lab Results  Component Value Date   CHOL 148 06/28/2012   HDL 62 06/28/2012   LDLCALC 73 06/28/2012   TRIG 63 06/28/2012   CHOLHDL 2.4 06/28/2012    ASSESSMENT/PLAN:  1. Routine general medical examination at a health care facility  POCT Urinalysis Dipstick  2. Pure hypercholesterolemia    3. Gouty arthropathy    4. Essential hypertension, benign    5. Unspecified vitamin D deficiency  Vitamin D, Ergocalciferol, (DRISDOL) 50000 UNITS CAPS  6. Osteopenia      HTN--borderline control, elevated today.  Discussed regular exercise, low sodium diet, and periodically monitor elsewhere.  Goals reviewed. Hyperlipidemia--well controlled Gout--uric acid is lower than it needs to be, but no side effects from allopurinol.  Continue current medication (offered lower dose, declines). Vitamin D deficiency--rx 50,000 IU weekly x 12 weeks, followed by 1000 IU OTC Vitamin D3 every day.  Discussed monthly self breast exams and yearly mammograms; at least 30 minutes of aerobic activity at least 5 days/week; proper sunscreen use reviewed; healthy diet, including goals of calcium and vitamin D intake and alcohol recommendations (less than or equal to 1 drink/day) reviewed; regular seatbelt use; changing batteries in smoke detectors.  Immunization recommendations discussed--see below.  Colonoscopy  recommendations reviewed, UTD.  Flu shot and pneumovax were recommended--risks/side effects reviewed.  She declines today, but will consider returning for a nurse visit for vaccines.  Also discussed shingles vaccine.  Recommended checking into cost/coverage, and setting up nurse visit if interested. Risks/side effects and benefits of vaccines were reviewed.  6 month med check with labs prior Labs prior--hepatic panel and vitamin D-OH, A1c and glucose.

## 2012-10-06 ENCOUNTER — Telehealth: Payer: Self-pay | Admitting: Internal Medicine

## 2012-10-06 DIAGNOSIS — I10 Essential (primary) hypertension: Secondary | ICD-10-CM

## 2012-10-06 MED ORDER — INDAPAMIDE 2.5 MG PO TABS: 2.5000 mg | ORAL_TABLET | ORAL | Status: DC

## 2012-10-06 NOTE — Telephone Encounter (Signed)
Done

## 2012-10-11 ENCOUNTER — Other Ambulatory Visit: Payer: Self-pay | Admitting: Gynecology

## 2012-10-11 DIAGNOSIS — R921 Mammographic calcification found on diagnostic imaging of breast: Secondary | ICD-10-CM

## 2012-11-01 ENCOUNTER — Ambulatory Visit
Admission: RE | Admit: 2012-11-01 | Discharge: 2012-11-01 | Disposition: A | Discharge status: Home / Self Care | Payer: BC Managed Care – PPO | Source: Ambulatory Visit | Attending: Gynecology | Admitting: Gynecology

## 2012-11-01 DIAGNOSIS — R921 Mammographic calcification found on diagnostic imaging of breast: Secondary | ICD-10-CM

## 2012-11-14 ENCOUNTER — Encounter: Payer: Self-pay | Admitting: Family Medicine

## 2012-11-14 ENCOUNTER — Ambulatory Visit (INDEPENDENT_AMBULATORY_CARE_PROVIDER_SITE_OTHER): Payer: BC Managed Care – PPO | Admitting: Family Medicine

## 2012-11-14 VITALS — BP 130/70 | HR 64 | Temp 98.7°F | Ht 61.0 in | Wt 126.0 lb

## 2012-11-14 DIAGNOSIS — R109 Unspecified abdominal pain: Secondary | ICD-10-CM

## 2012-11-14 DIAGNOSIS — M109 Gout, unspecified: Secondary | ICD-10-CM

## 2012-11-14 DIAGNOSIS — R1030 Lower abdominal pain, unspecified: Secondary | ICD-10-CM

## 2012-11-14 DIAGNOSIS — M545 Low back pain: Secondary | ICD-10-CM

## 2012-11-14 LAB — POCT URINALYSIS DIPSTICK
Bilirubin, UA: NEGATIVE
Glucose, UA: NEGATIVE
Ketones, UA: NEGATIVE
Protein, UA: NEGATIVE

## 2012-11-14 MED ORDER — ALLOPURINOL 300 MG PO TABS: 300.0000 mg | ORAL_TABLET | Freq: Every day | ORAL | Status: DC

## 2012-11-14 NOTE — Patient Instructions (Addendum)
We are sending your urine to make sure there is no bladder infection.  We will call with an antibiotic if it is abnormal.  This could cause your lower abdominal pain, and sometimes contributes to back pain.  Back pain, however, is very common.  Since you said you were known to have some arthritis in your back, your discomfort may just be related to arthritis/degenerative changes.  Exercise and stretching will help.  Use tylenol as needed for pain.  Degenerative Arthritis You have osteoarthritis. This is the wear and tear arthritis that comes with aging. It is also called degenerative arthritis. This is common in people past middle age. It is caused by stress on the joints. The large weight bearing joints of the lower extremities are most often affected. The knees, hips, back, neck, and hands can become painful, swollen, and stiff. This is the most common type of arthritis. It comes on with age, carrying too much weight, or from an injury. Treatment includes resting the sore joint until the pain and swelling improve. Crutches or a walker may be needed for severe flares. Only take over-the-counter or prescription medicines for pain, discomfort, or fever as directed by your caregiver. Local heat therapy may improve motion. Cortisone shots into the joint are sometimes used to reduce pain and swelling during flares. Osteoarthritis is usually not crippling and progresses slowly. There are things you can do to decrease pain:  Avoid high impact activities.  Exercise regularly.  Low impact exercises such as walking, biking and swimming help to keep the muscles strong and keep normal joint function.  Stretching helps to keep your range of motion.  Lose weight if you are overweight. This reduces joint stress. In severe cases when you have pain at rest or increasing disability, joint surgery may be helpful. See your caregiver for follow-up treatment as recommended.  SEEK IMMEDIATE MEDICAL CARE IF:   You  have severe joint pain.  Marked swelling and redness in your joint develops.  You develop a high fever. Document Released: 09/07/2005 Document Revised: 11/30/2011 Document Reviewed: 02/07/2007 [facility] Patient Information 2013 [place], [place].

## 2012-11-14 NOTE — Progress Notes (Signed)
Chief Complaint  Patient presents with  . Back Pain    lower back pain that radiates towards her hips x 1 month off and on.   Complaining of central lower back pain, which sometimes radiates out to both sides.  Pain is intermittent, coming and going over the last month, never bothers her at night.  She reports being told that x-rays in past showed signs of arthritis.  Hurts with sitting, standing (not sleeping), but really no precipitating factors or any routine.  She has tried Advil, which seems to help, but sometimes she thinks the pain resolves on its own.  She currently has no pain.  She really can't think of anything that causes the pain to start (not with exercising, certain activities, shoes, etc).  Pain is 5-6/10, throbbing like a mild toothache, when she has it.  She denies any urinary complaints.  She has developed some lower abdominal discomfort over the last 2-3 days--mild, intermittent.  Bowels are regular.  She is also complaining of hypopigmentation to her face.  Denies any itching, new creams.  Only uses moisturizer.  No significant recent spread.  She is also complaining about her ears, some plugging, wanting them checked.  Needs refill on allopurinol.  No recent problems with gout flares  Past Medical History  Diagnosis Date  . COPD (chronic obstructive pulmonary disease)   . Gouty arthropathy     R foot  . LBP (low back pain)   . Dietary surveillance and counseling   . Prediabetes   . Vitamin D deficiency   . Hypertension     controlled  . Hyperlipidemia 1997  . Glaucoma     Dr. Charlotte Sanes  . Hx of adenomatous colonic polyps 2012    Dr. Kinnie Scales  . Internal hemorrhoids 2012  . Diverticulosis 2012  . Postmenopausal     sees Dr. Nicholas Lose for GYN care  . Osteopenia 04/2010    (DEXA ordered by Dr. Nicholas Lose, scheduled for 04/2012)  . Smoker    Past Surgical History  Procedure Laterality Date  . Appendectomy  1966  . Tubal ligation  1980  . Colonoscopy  03/2011    Dr. Kinnie Scales   . Band hemorrhoidectomy  06/2011    Dr. Kinnie Scales   History   Social History  . Marital Status: Married    Spouse Name: N/A    Number of Children: 2  . Years of Education: N/A   Occupational History  . retired Futures trader)    Social History Main Topics  . Smoking status: Current Every Day Smoker -- 0.50 packs/day for 40 years    Types: Cigarettes  . Smokeless tobacco: Never Used  . Alcohol Use: Yes     Comment: 2 drinks per week.  . Drug Use: No  . Sexually Active: Yes -- Female partner(s)   Other Topics Concern  . Not on file   Social History Narrative   Lives with husband.  1 daughter in [place], the other daughter lives down the street.   Current outpatient prescriptions:allopurinol (ZYLOPRIM) 300 MG tablet, Take 1 tablet (300 mg total) by mouth daily., Disp: 90 tablet, Rfl: 0;  Calcium Carbonate-Vitamin D (CALCIUM 600+D) 600-400 MG-UNIT per tablet, Take 1 tablet by mouth daily., Disp: , Rfl: ;  dorzolamide-timolol (COSOPT) 22.3-6.8 MG/ML ophthalmic solution, Place 1 drop into both eyes 2 (two) times daily., Disp: , Rfl:  estradiol (VAGIFEM) 25 MCG vaginal tablet, Place 25 mcg vaginally once a week., Disp: , Rfl: ;  indapamide (LOZOL) 2.5  MG tablet, Take 1 tablet (2.5 mg total) by mouth every morning., Disp: 90 tablet, Rfl: 0;  potassium chloride 20 MEQ/15ML (10%) solution, Take 20 mEq by mouth daily., Disp: , Rfl: ;  simvastatin (ZOCOR) 40 MG tablet, Take 40 mg by mouth every evening., Disp: , Rfl:   No Known Allergies  ROS: Denies fevers, chills, nausea or vomiting, diarrhea, change in bowels.  +abdominal discomfort.  Denies rash.  Denies any numbness, tingling, weakness.  Complaining of some ear plugging. Denies ear pain, decreased hearing, tinnitus, vertigo.  Denies chest pain, shortness of breath, URI symptoms, or other concerns except per HPI  PHYSICAL EXAM: BP 130/70  Pulse 64  Ht 5\' 1"  (1.549 m)  Wt 126 lb (57.153 kg)  BMI 23.82 kg/m2 Well developed, pleasant female in  no distress HEENT: TM's and EAC's normal bilaterally SKIN: Hypopigmentation to both lower cheeks, chin (in small circular areas which seem to coalesce).  No flaking or erythema. Neck: no lymphadenopathy Heart: regular rate and rhythm without murmur Lungs: clear bilaterally Back: no spine or CVA tenderness.  No SI joint tenderness.  During visit, had some mild R flank discomfort develop (nontender) Abdomen: mild suprapubic tenderness, otherwise normal exam Extremities: no edema Psych: very mildly anxious, otherwise normal Neuro: alert and oriented. Normal strength, sensation, DTR's and straight leg raise. Normal gait.  Cranial nerves intact.  Urine dip:  Trace leuks and blood  ASSESSMENT/PLAN:  LBP (low back pain) - Plan: POCT Urinalysis Dipstick  Gout - Plan: allopurinol (ZYLOPRIM) 300 MG tablet  Suprapubic pain, unspecified laterality - Plan: Urine culture  Back pain--likely related to degenerative changes.  Exercise, stretches and tylenol as needed.  Treatments of arthritis reviewed.  Recommended acetaminophen rather than NSAIDS.  Suprapubic pain--send for urine culture, and treat if abnormal.  Minimal urinary symptoms and slightly abnormal urine dip.  Facial hypopigmentation--discussed differential (dry skin, tinea versicolor, side effect of certain facial creams).  Discussed using selsun blue to wash face if progressing.

## 2012-11-15 ENCOUNTER — Encounter: Payer: Self-pay | Admitting: Family Medicine

## 2012-11-16 LAB — URINE CULTURE: Colony Count: 40000

## 2012-12-27 ENCOUNTER — Other Ambulatory Visit: Payer: BC Managed Care – PPO

## 2012-12-27 DIAGNOSIS — E559 Vitamin D deficiency, unspecified: Secondary | ICD-10-CM

## 2012-12-27 DIAGNOSIS — R7303 Prediabetes: Secondary | ICD-10-CM

## 2012-12-27 DIAGNOSIS — E7800 Pure hypercholesterolemia, unspecified: Secondary | ICD-10-CM

## 2012-12-28 LAB — HEPATIC FUNCTION PANEL
AST: 46 U/L — ABNORMAL HIGH (ref 0–37)
Alkaline Phosphatase: 70 U/L (ref 39–117)
Indirect Bilirubin: 0.3 mg/dL (ref 0.0–0.9)
Total Bilirubin: 0.4 mg/dL (ref 0.3–1.2)
Total Protein: 7.1 g/dL (ref 6.0–8.3)

## 2012-12-28 LAB — VITAMIN D 25 HYDROXY (VIT D DEFICIENCY, FRACTURES): Vit D, 25-Hydroxy: 46 ng/mL (ref 30–89)

## 2012-12-29 ENCOUNTER — Encounter: Payer: Self-pay | Admitting: Family Medicine

## 2012-12-29 ENCOUNTER — Ambulatory Visit (INDEPENDENT_AMBULATORY_CARE_PROVIDER_SITE_OTHER): Payer: BC Managed Care – PPO | Admitting: Family Medicine

## 2012-12-29 VITALS — BP 118/82 | HR 68 | Ht 61.0 in | Wt 126.0 lb

## 2012-12-29 DIAGNOSIS — F17200 Nicotine dependence, unspecified, uncomplicated: Secondary | ICD-10-CM

## 2012-12-29 DIAGNOSIS — E559 Vitamin D deficiency, unspecified: Secondary | ICD-10-CM

## 2012-12-29 DIAGNOSIS — Z79899 Other long term (current) drug therapy: Secondary | ICD-10-CM

## 2012-12-29 DIAGNOSIS — I10 Essential (primary) hypertension: Secondary | ICD-10-CM

## 2012-12-29 DIAGNOSIS — M109 Gout, unspecified: Secondary | ICD-10-CM

## 2012-12-29 DIAGNOSIS — E7800 Pure hypercholesterolemia, unspecified: Secondary | ICD-10-CM

## 2012-12-29 NOTE — Patient Instructions (Addendum)
Please try and quit smoking--start thinking about why/when you smoke (habit, boredom, stress) in order to come up with effective strategies to cut back or quit. Available resources to help you quit include free counseling through [facility] ([place]Quitline.[facility] or 1-800-QUITNOW), smoking cessation classes through [facility] (call to find out schedule), over-the-counter nicotine replacements, and e-cigarettes (although this may not help break the hand-mouth habit).  Many insurance companies also have smoking cessation programs (which may decrease the cost of patches, meds if enrolled).  If these methods are not effective for you, and you are motivated to quit, return to discuss the possibility of prescription medications.  Please start taking Vitamin D3 1000 IU once daily (in addition to your calcium with D supplement).  This is over-the counter.  Try and exercise at least 30 minutes daily.

## 2012-12-29 NOTE — Progress Notes (Signed)
Chief Complaint  Patient presents with  . Hyperlipidemia    med check, labs done 12/27/12.   Hypertension follow-up:  Blood pressures elsewhere are 130's/?.  Denies dizziness, headaches, chest pain.  Denies side effects of medications. Hyperlipidemia follow-up:  Patient is reportedly following a low-fat, low cholesterol diet.  Compliant with medications and denies medication side effects.  Eating out less.    Hasn't been exercising much due to cold weather.  Plans to start exercising.  Vitamin D deficiency:  She completed the prescription, but hasn't started any additional Vitamin D supplement, just the calcium +D chew that she was taking prior to diagnosis of Vitamin D deficiency.  She has trouble taking large pills  Gout:  No recent flares  Back pain has resolved (last seen 6 weeks ago), used Tylenol prn, hasn't needed recently. She continues to smoke  Past Medical History  Diagnosis Date  . COPD (chronic obstructive pulmonary disease)   . Gouty arthropathy     R foot  . LBP (low back pain)   . Dietary surveillance and counseling   . Prediabetes   . Vitamin D deficiency   . Hypertension     controlled  . Hyperlipidemia 1997  . Glaucoma     Dr. Charlotte Sanes  . Hx of adenomatous colonic polyps 2012    Dr. Kinnie Scales  . Internal hemorrhoids 2012  . Diverticulosis 2012  . Postmenopausal     sees Dr. Nicholas Lose for GYN care  . Osteopenia 04/2010    (DEXA ordered by Dr. Nicholas Lose, scheduled for 04/2012)  . Smoker    Past Surgical History  Procedure Laterality Date  . Appendectomy  1966  . Tubal ligation  1980  . Colonoscopy  03/2011    Dr. Kinnie Scales  . Band hemorrhoidectomy  06/2011    Dr. Kinnie Scales   History   Social History  . Marital Status: Married    Spouse Name: N/A    Number of Children: 2  . Years of Education: N/A   Occupational History  . retired Futures trader)    Social History Main Topics  . Smoking status: Current Every Day Smoker -- 0.50 packs/day for 40 years    Types:  Cigarettes  . Smokeless tobacco: Never Used  . Alcohol Use: Yes     Comment: 2 drinks per week.  . Drug Use: No  . Sexually Active: Yes -- Female partner(s)   Other Topics Concern  . Not on file   Social History Narrative   Lives with husband.  1 daughter in [place], the other daughter lives down the street.   Current Outpatient Prescriptions on File Prior to Visit  Medication Sig Dispense Refill  . allopurinol (ZYLOPRIM) 300 MG tablet Take 1 tablet (300 mg total) by mouth daily.  90 tablet  0  . Calcium Carbonate-Vitamin D (CALCIUM 600+D) 600-400 MG-UNIT per tablet Take 1 tablet by mouth daily.      . dorzolamide-timolol (COSOPT) 22.3-6.8 MG/ML ophthalmic solution Place 1 drop into both eyes 2 (two) times daily.      Marland Kitchen estradiol (VAGIFEM) 25 MCG vaginal tablet Place 25 mcg vaginally once a week.      . indapamide (LOZOL) 2.5 MG tablet Take 1 tablet (2.5 mg total) by mouth every morning.  90 tablet  0  . potassium chloride 20 MEQ/15ML (10%) solution Take 20 mEq by mouth daily.      . simvastatin (ZOCOR) 40 MG tablet Take 40 mg by mouth every evening.  No current facility-administered medications on file prior to visit.   No Known Allergies  ROS:  No chest pain, palpitations, dyspnea, cough, fever, URI symptoms, no GI complaints.  No further back pain, joint pains. No headaches, dizziness, edema, skin concerns or other complaints.  See HPI  PHYSICAL EXAM: BP 118/82  Pulse 68  Ht 5\' 1"  (1.549 m)  Wt 126 lb (57.153 kg)  BMI 23.82 kg/m2 Well developed, pleasant female in no distress Neck: no lymphadenopathy, thyromegaly or mass Heart: regular rate and rhythm Lungs: clear bilaterally Back: no spine or CVA tenderness Abdomen: soft, nontender, no mass Extremities: no edema, normal pulses Skin: no rash Psych: normal mood, affect, hygiene and grooming Neuro: alert and oriented.  Normal gait, strength, sensation  Lab Results  Component Value Date   ALT 19 12/27/2012   AST 46*  12/27/2012   ALKPHOS 70 12/27/2012   BILITOT 0.4 12/27/2012   Lab Results  Component Value Date   HGBA1C 5.7* 12/27/2012   Glucose 75 Vitamin DOH 46  ASSESSMENT/PLAN:  Essential hypertension, benign - controlled - Plan: Comprehensive metabolic panel  Pure hypercholesterolemia - controlled per last lipids 6 months ago.  mild elevation in one of the LFT's remains stable.  recheck 6 months with lipids - Plan: Lipid panel, Comprehensive metabolic panel  Unspecified vitamin D deficiency - adequately replaced with rx, but levels will drop again without increasing daily supplement.  start 1000 IU daily - Plan: Vitamin D 25 hydroxy  Tobacco use disorder - counseled extensively.  set quit date  Gout - Plan: Uric acid  Encounter for long-term (current) use of other medications - Plan: CBC with Differential, Comprehensive metabolic panel, Vitamin D 25 hydroxy, TSH, Uric acid  Continue current meds--rx's from previous doctor but she reports not needing refills yet. Will call when they are needed, declined today.  Exercise at least 30 minutes daily  Add Vitamin D 1000 IU daily   F/u 6 months for CPE/med check with fasting labs prior

## 2013-01-31 ENCOUNTER — Telehealth: Payer: Self-pay | Admitting: Family Medicine

## 2013-02-01 ENCOUNTER — Other Ambulatory Visit: Payer: Self-pay | Admitting: *Deleted

## 2013-02-01 MED ORDER — SIMVASTATIN 40 MG PO TABS: 40.0000 mg | ORAL_TABLET | Freq: Every evening | ORAL | Status: DC

## 2013-02-01 NOTE — Telephone Encounter (Signed)
DONE

## 2013-02-01 NOTE — Telephone Encounter (Signed)
Done

## 2013-03-11 ENCOUNTER — Other Ambulatory Visit: Payer: Self-pay | Admitting: Family Medicine

## 2013-03-22 ENCOUNTER — Other Ambulatory Visit: Payer: Self-pay | Admitting: Family Medicine

## 2013-03-22 DIAGNOSIS — I10 Essential (primary) hypertension: Secondary | ICD-10-CM

## 2013-03-22 MED ORDER — INDAPAMIDE 2.5 MG PO TABS: 2.5000 mg | ORAL_TABLET | ORAL | Status: DC

## 2013-04-05 ENCOUNTER — Other Ambulatory Visit: Payer: Self-pay | Admitting: Family Medicine

## 2013-04-05 ENCOUNTER — Telehealth: Payer: Self-pay | Admitting: Family Medicine

## 2013-04-05 DIAGNOSIS — R921 Mammographic calcification found on diagnostic imaging of breast: Secondary | ICD-10-CM

## 2013-04-05 DIAGNOSIS — M109 Gout, unspecified: Secondary | ICD-10-CM

## 2013-04-05 MED ORDER — ALLOPURINOL 300 MG PO TABS: 300.0000 mg | ORAL_TABLET | Freq: Every day | ORAL | Status: DC

## 2013-04-05 NOTE — Telephone Encounter (Signed)
Done

## 2013-04-05 NOTE — Telephone Encounter (Signed)
This was done #90 with 1 refill 6/21 to [facility].  Is this a request to change pharmacy? Ok to have meds refilled until labs due again in October

## 2013-05-09 ENCOUNTER — Telehealth: Payer: Self-pay | Admitting: Family Medicine

## 2013-05-09 MED ORDER — SIMVASTATIN 40 MG PO TABS: 40.0000 mg | ORAL_TABLET | Freq: Every evening | ORAL | Status: DC

## 2013-05-09 NOTE — Telephone Encounter (Signed)
done

## 2013-05-09 NOTE — Telephone Encounter (Signed)
Please send refill that is left on simvastatin to [facility]

## 2013-05-16 ENCOUNTER — Ambulatory Visit
Admission: RE | Admit: 2013-05-16 | Discharge: 2013-05-16 | Disposition: A | Discharge status: Home / Self Care | Payer: BC Managed Care – PPO | Source: Ambulatory Visit | Attending: Family Medicine | Admitting: Family Medicine

## 2013-05-16 DIAGNOSIS — R921 Mammographic calcification found on diagnostic imaging of breast: Secondary | ICD-10-CM

## 2013-05-16 LAB — HM MAMMOGRAPHY

## 2013-07-04 ENCOUNTER — Other Ambulatory Visit: Payer: Self-pay | Admitting: Family Medicine

## 2013-07-04 ENCOUNTER — Other Ambulatory Visit: Payer: BC Managed Care – PPO

## 2013-07-04 DIAGNOSIS — M109 Gout, unspecified: Secondary | ICD-10-CM

## 2013-07-04 DIAGNOSIS — E7800 Pure hypercholesterolemia, unspecified: Secondary | ICD-10-CM

## 2013-07-04 DIAGNOSIS — E559 Vitamin D deficiency, unspecified: Secondary | ICD-10-CM

## 2013-07-04 DIAGNOSIS — Z79899 Other long term (current) drug therapy: Secondary | ICD-10-CM

## 2013-07-04 DIAGNOSIS — I10 Essential (primary) hypertension: Secondary | ICD-10-CM

## 2013-07-04 LAB — COMPREHENSIVE METABOLIC PANEL
ALT: 23 U/L (ref 0–35)
AST: 57 U/L — ABNORMAL HIGH (ref 0–37)
Albumin: 4 g/dL (ref 3.5–5.2)
Calcium: 9.6 mg/dL (ref 8.4–10.5)
Chloride: 95 mEq/L — ABNORMAL LOW (ref 96–112)
Potassium: 3.2 mEq/L — ABNORMAL LOW (ref 3.5–5.3)
Sodium: 135 mEq/L (ref 135–145)

## 2013-07-04 LAB — LIPID PANEL
LDL Cholesterol: 45 mg/dL (ref 0–99)
VLDL: 13 mg/dL (ref 0–40)

## 2013-07-04 LAB — URIC ACID: Uric Acid, Serum: 1.9 mg/dL — ABNORMAL LOW (ref 2.4–7.0)

## 2013-07-05 LAB — CBC WITH DIFFERENTIAL/PLATELET
Basophils Relative: 0 % (ref 0–1)
Eosinophils Absolute: 0.1 10*3/uL (ref 0.0–0.7)
HCT: 31.5 % — ABNORMAL LOW (ref 36.0–46.0)
Hemoglobin: 10.9 g/dL — ABNORMAL LOW (ref 12.0–15.0)
MCH: 33.5 pg (ref 26.0–34.0)
MCHC: 34.6 g/dL (ref 30.0–36.0)
Monocytes Absolute: 0.4 10*3/uL (ref 0.1–1.0)
Monocytes Relative: 8 % (ref 3–12)

## 2013-07-06 ENCOUNTER — Encounter: Payer: Self-pay | Admitting: Family Medicine

## 2013-07-06 ENCOUNTER — Ambulatory Visit (INDEPENDENT_AMBULATORY_CARE_PROVIDER_SITE_OTHER): Payer: BC Managed Care – PPO | Admitting: Family Medicine

## 2013-07-06 VITALS — BP 140/80 | HR 68 | Ht 61.0 in | Wt 123.0 lb

## 2013-07-06 DIAGNOSIS — E7800 Pure hypercholesterolemia, unspecified: Secondary | ICD-10-CM

## 2013-07-06 DIAGNOSIS — M109 Gout, unspecified: Secondary | ICD-10-CM

## 2013-07-06 DIAGNOSIS — F17200 Nicotine dependence, unspecified, uncomplicated: Secondary | ICD-10-CM

## 2013-07-06 DIAGNOSIS — E876 Hypokalemia: Secondary | ICD-10-CM

## 2013-07-06 DIAGNOSIS — Z0000 Encounter for general adult medical examination without abnormal findings: Secondary | ICD-10-CM

## 2013-07-06 DIAGNOSIS — M8580 Other specified disorders of bone density and structure, unspecified site: Secondary | ICD-10-CM

## 2013-07-06 DIAGNOSIS — N959 Unspecified menopausal and perimenopausal disorder: Secondary | ICD-10-CM

## 2013-07-06 DIAGNOSIS — M899 Disorder of bone, unspecified: Secondary | ICD-10-CM

## 2013-07-06 DIAGNOSIS — I10 Essential (primary) hypertension: Secondary | ICD-10-CM

## 2013-07-06 LAB — POCT URINALYSIS DIPSTICK
Bilirubin, UA: NEGATIVE
Blood, UA: NEGATIVE
Glucose, UA: NEGATIVE
Ketones, UA: NEGATIVE
Nitrite, UA: NEGATIVE
Spec Grav, UA: 1.01

## 2013-07-06 LAB — FOLATE: Folate: 17.6 ng/mL

## 2013-07-06 LAB — VITAMIN B12: Vitamin B-12: 331 pg/mL (ref 211–911)

## 2013-07-06 LAB — IRON: Iron: 110 ug/dL (ref 42–145)

## 2013-07-06 MED ORDER — ALLOPURINOL 300 MG PO TABS: 300.0000 mg | ORAL_TABLET | Freq: Every day | ORAL | Status: DC

## 2013-07-06 MED ORDER — POTASSIUM CHLORIDE 20 MEQ/15ML (10%) PO LIQD: 20.0000 meq | Freq: Every day | ORAL | Status: DC

## 2013-07-06 MED ORDER — SIMVASTATIN 40 MG PO TABS: 40.0000 mg | ORAL_TABLET | Freq: Every evening | ORAL | Status: DC

## 2013-07-06 MED ORDER — ESTRADIOL 10 MCG VA TABS: 1.0000 | ORAL_TABLET | VAGINAL | Status: DC

## 2013-07-06 NOTE — Progress Notes (Signed)
Chief Complaint  Patient presents with  . Annual Exam    nonfasting annual exam with pap, had labs done already. No major concerns. Unsure if she would like flu vaccine today.    Bailey Parker is a 62 y.o. female who presents for a complete physical.  She is also here for follow up on her chronic medicla problems and f/u on her labs.  She has no specific concerns.  Hypertension follow-up:  Blood pressures elsewhere are 130-140/70-80.  Denies dizziness, headaches, chest pain.  Denies side effects of medications. Denies any muscle cramps.  Hyperlipidemia follow-up:  Patient is reportedly following a low-fat, low cholesterol diet.  Compliant with medications and denies medication side effects  Gout:  She has not had any recurrence of gout pain in her foot in 2 years.    Postmenopausal symptoms:  She has been on Vagifem for several years, from Dr. Nicholas Lose.  She is transferring GYN care here since he retired.  Denies hot flashes, vaginal dryness or pain.  Believes she has been on this for over 5 years.  Immunization History  Administered Date(s) Administered  . Td 08/03/1995  . Tdap 02/02/2006   Last Pap smear: 05/12/12  Last mammogram: 04/2013  Last colonoscopy: 04/16/2011  Last DEXA: 2013 Dentist: twice yearly  Ophtho: twice yearly  Exercise: "not much".  She is walking 15 minutes 3x/week in the neighborhood  Past Medical History  Diagnosis Date  . COPD (chronic obstructive pulmonary disease)   . Gouty arthropathy     R foot  . LBP (low back pain)   . Dietary surveillance and counseling   . Prediabetes   . Vitamin D deficiency   . Hypertension     controlled  . Hyperlipidemia 1997  . Glaucoma     Dr. Charlotte Sanes  . Hx of adenomatous colonic polyps 2012    Dr. Kinnie Scales  . Internal hemorrhoids 2012  . Diverticulosis 2012  . Postmenopausal   . Osteopenia 04/2010    (DEXA ordered by Dr. Nicholas Lose, scheduled for 04/2012)  . Smoker     Past Surgical History  Procedure Laterality Date  .  Appendectomy  1966  . Tubal ligation  1980  . Colonoscopy  03/2011    Dr. Kinnie Scales  . Band hemorrhoidectomy  06/2011    Dr. Kinnie Scales    History   Social History  . Marital Status: Married    Spouse Name: N/A    Number of Children: 2  . Years of Education: N/A   Occupational History  . retired Futures trader)    Social History Main Topics  . Smoking status: Current Every Day Smoker -- 0.50 packs/day for 40 years    Types: Cigarettes  . Smokeless tobacco: Never Used     Comment: cutting back; preparing to quit  . Alcohol Use: Yes     Comment: 2 drinks per week.  . Drug Use: No  . Sexual Activity: Yes    Partners: Male   Other Topics Concern  . Not on file   Social History Narrative   Lives with husband.  1 daughter in [place], the other daughter lives down the street.    Family History  Problem Relation Age of Onset  . Alzheimer's disease Mother   . Hypertension Mother   . Heart disease Father   . Diabetes Father   . Hypertension Brother   . Diabetes Brother   . Cancer Neg Hx     Current outpatient prescriptions:allopurinol (ZYLOPRIM) 300 MG tablet,  Take 1 tablet (300 mg total) by mouth daily., Disp: 90 tablet, Rfl: 0;  Calcium Carbonate-Vitamin D (CALCIUM 600+D) 600-400 MG-UNIT per tablet, Take 1 tablet by mouth daily., Disp: , Rfl: ;  cholecalciferol (VITAMIN D) 1000 UNITS tablet, Take 1,000 Units by mouth daily., Disp: , Rfl:  dorzolamide-timolol (COSOPT) 22.3-6.8 MG/ML ophthalmic solution, Place 1 drop into both eyes 2 (two) times daily., Disp: , Rfl: ;  indapamide (LOZOL) 2.5 MG tablet, Take 1 tablet (2.5 mg total) by mouth every morning., Disp: 90 tablet, Rfl: 1;  simvastatin (ZOCOR) 40 MG tablet, Take 1 tablet (40 mg total) by mouth every evening., Disp: 90 tablet, Rfl: 3 Estradiol 10 MCG TABS vaginal tablet, Place 1 tablet (10 mcg total) vaginally once a week., Disp: 12 tablet, Rfl: 3;  potassium chloride 20 MEQ/15ML (10%) solution, Take 15 mLs (20 mEq total) by mouth  daily., Disp: 1500 mL, Rfl: 1  No Known Allergies  ROS: The patient denies anorexia, fever, weight changes, headaches, vision changes, decreased hearing, ear pain, sore throat, breast concerns, chest pain, palpitations, dizziness, syncope, dyspnea on exertion, cough, swelling, nausea, vomiting, diarrhea, constipation, abdominal pain, melena, hematochezia, indigestion/heartburn, hematuria, incontinence, dysuria, vaginal bleeding, discharge, odor or itch, genital lesions, joint pains, numbness, tingling, weakness, tremor, suspicious skin lesions, depression, anxiety, abnormal bleeding/bruising, or enlarged lymph nodes.  PHYSICAL EXAM: BP 140/80  Pulse 68  Ht 5\' 1"  (1.549 m)  Wt 123 lb (55.792 kg)  BMI 23.25 kg/m2  General Appearance:    Alert, cooperative, no distress, appears stated age  Head:    Normocephalic, without obvious abnormality, atraumatic  Eyes:    PERRL, conjunctiva/corneas clear, EOM's intact, fundi    benign  Ears:    Normal TM's and external ear canals  Nose:   Nares normal, mucosa normal, no drainage or sinus   tenderness  Throat:   Lips, mucosa, and tongue normal; teeth and gums normal. Torus pallatini  Neck:   Supple, no lymphadenopathy;  thyroid:  no   enlargement/tenderness/nodules; no carotid   bruit or JVD  Back:    Spine nontender, no curvature, ROM normal, no CVA     tenderness  Lungs:     Clear to auscultation bilaterally without wheezes, rales or     ronchi; respirations unlabored  Chest Wall:    No tenderness or deformity   Heart:    Regular rate and rhythm, S1 and S2 normal, no murmur, rub   or gallop  Breast Exam:    No tenderness, masses, or nipple discharge or inversion.      No axillary lymphadenopathy.  Large, pendulous breasts  Abdomen:     Soft, non-tender, nondistended, normoactive bowel sounds,    no masses, no hepatosplenomegaly  Genitalia:    Normal external genitalia without lesions.  BUS and vagina normal; no cervical motion tenderness. No  abnormal vaginal discharge.  Uterus and adnexa not enlarged, nontender, no masses.  Pap not performed  Rectal:    Normal tone, no masses or tenderness; guaiac negative stool  Extremities:   No clubbing, cyanosis or edema  Pulses:   2+ and symmetric all extremities  Skin:   Skin color, texture, turgor normal, no rashes or lesions  Lymph nodes:   Cervical, supraclavicular, and axillary nodes normal  Neurologic:   CNII-XII intact, normal strength, sensation and gait; reflexes 2+ and symmetric throughout          Psych:   Normal mood, affect, hygiene and grooming.      Chemistry  Component Value Date/Time   NA 135 07/04/2013 1006   K 3.2* 07/04/2013 1006   CL 95* 07/04/2013 1006   CO2 31 07/04/2013 1006   BUN 8 07/04/2013 1006   CREATININE 0.47* 07/04/2013 1006      Component Value Date/Time   CALCIUM 9.6 07/04/2013 1006   ALKPHOS 73 07/04/2013 1006   AST 57* 07/04/2013 1006   ALT 23 07/04/2013 1006   BILITOT 0.4 07/04/2013 1006     Glucose 77 Vitamin D-OH 50  Lab Results  Component Value Date   TSH 1.747 07/04/2013   Lab Results  Component Value Date   CHOL 113 07/04/2013   HDL 55 07/04/2013   LDLCALC 45 07/04/2013   TRIG 64 07/04/2013   CHOLHDL 2.1 07/04/2013   Lab Results  Component Value Date   WBC 5.1 07/04/2013   HGB 10.9* 07/04/2013   HCT 31.5* 07/04/2013   MCV 96.9 07/04/2013   PLT 223 07/04/2013   Uric acid 1.9  ASSESSMENT/PLAN:  Routine general medical examination at a health care facility - Plan: POCT Urinalysis Dipstick, Visual acuity screening  Tobacco use disorder - thinks she is ready to quit.  counseled in detail  Pure hypercholesterolemia - controlled - Plan: simvastatin (ZOCOR) 40 MG tablet  Osteopenia - continue Calcium, vitamin D, weight bearing exercise.  not due for DEXA yet  Gouty arthropathy - well controlled.  uric acid lower than needed, but prefers to stay on current med (fears recurrence); no strong reason to change, likely  would be fine on 100mg   Essential hypertension, benign - controlled  Gout - Plan: allopurinol (ZYLOPRIM) 300 MG tablet  Hypokalemia - due to noncompliance with K supplement. discussed important of taking, risks, and dietary sources - Plan: potassium chloride 20 MEQ/15ML (10%) solution  Postmenopausal symptoms - consider cutting back and tapering off; maintaining at lowest effective dose of HRT.  risks reviewed.  mammo UTD - Plan: Estradiol 10 MCG TABS vaginal tablet  Hyperlipidemia--well controlled on current regimen.  Lipids lower than last year.  Mildly elevated LFT--stable/unchanged.  New anemia--add on additional labs.  Smoking cessation counseling given.  Interested in quitting.  Encouraged to set quit date  Sign release--need Dr. Johnn Hai records (including DEXA results, pap, etc)  Lab only visit (CBC and potassium) sooner (?1-2 months, depends on result of added labs), vs checking at f/u in 6 months.  Declines all vaccines today while husband is out of town.  Return for [place] for flu shot, prevnar and zostavax.  Will give pneumovax in 5 years.  If pt declines getting all 3 together, then return a month later for zostavax

## 2013-07-06 NOTE — Patient Instructions (Addendum)
HEALTH MAINTENANCE RECOMMENDATIONS:  It is recommended that you get at least 30 minutes of aerobic exercise at least 5 days/week (for weight loss, you may need as much as 60-90 minutes). This can be any activity that gets your heart rate up. This can be divided in 10-15 minute intervals if needed, but try and build up your endurance at least once a week.  Weight bearing exercise is also recommended twice weekly.  Eat a healthy diet with lots of vegetables, fruits and fiber.  "Colorful" foods have a lot of vitamins (ie green vegetables, tomatoes, red peppers, etc).  Limit sweet tea, regular sodas and alcoholic beverages, all of which has a lot of calories and sugar.  Up to 1 alcoholic drink daily may be beneficial for women (unless trying to lose weight, watch sugars).  Drink a lot of water.  Calcium recommendations are 1200-1500 mg daily (1500 mg for postmenopausal women or women without ovaries), and vitamin D 1000 IU daily.  This should be obtained from diet and/or supplements (vitamins), and calcium should not be taken all at once, but in divided doses.  Monthly self breast exams and yearly mammograms for women over the age of 48 is recommended.  Sunscreen of at least SPF 30 should be used on all sun-exposed parts of the skin when outside between the hours of 10 am and 4 pm (not just when at beach or pool, but even with exercise, golf, tennis, and yard work!)  Use a sunscreen that says "broad spectrum" so it covers both UVA and UVB rays, and make sure to reapply every 1-2 hours.  Remember to change the batteries in your smoke detectors when changing your clock times in the spring and fall.  Use your seat belt every time you are in a car, and please drive safely and not be distracted with cell phones and texting while driving.  Please try and quit smoking--start thinking about why/when you smoke (habit, boredom, stress) in order to come up with effective strategies to cut back or quit. Available  resources to help you quit include free counseling through [facility] Quitline ([place]Quitline.com or 1-800-QUITNOW), smoking cessation classes through [facility] (call to find out schedule), over-the-counter nicotine replacements, and e-cigarettes (although this may not help break the hand-mouth habit).  Many insurance companies also have smoking cessation programs (which may decrease the cost of patches, meds if enrolled).  If these methods are not effective for you, and you are motivated to quit, return to discuss the possibility of prescription medications.  Return for nurse visit for flu shot and pneumonia vaccine (Prevnar) Double check shingles vaccine coverage.  If covered, can either get all 3 the same day, or return 1 month later for the shingles vaccine if you only want 2 at once (flu and pneumonia).

## 2013-07-26 ENCOUNTER — Encounter: Payer: Self-pay | Admitting: *Deleted

## 2013-09-26 ENCOUNTER — Telehealth: Payer: Self-pay | Admitting: Family Medicine

## 2013-09-26 DIAGNOSIS — I10 Essential (primary) hypertension: Secondary | ICD-10-CM

## 2013-09-26 MED ORDER — INDAPAMIDE 2.5 MG PO TABS: 2.5000 mg | ORAL_TABLET | ORAL | Status: DC

## 2013-09-26 NOTE — Telephone Encounter (Signed)
done

## 2013-09-26 NOTE — Telephone Encounter (Signed)
Pt needs refill on Lozol sent to [facility] order pharm.

## 2013-10-19 ENCOUNTER — Telehealth: Payer: Self-pay | Admitting: Family Medicine

## 2013-10-19 ENCOUNTER — Other Ambulatory Visit: Payer: Self-pay | Admitting: *Deleted

## 2013-10-19 DIAGNOSIS — M109 Gout, unspecified: Secondary | ICD-10-CM

## 2013-10-19 MED ORDER — ALLOPURINOL 300 MG PO TABS: 300.0000 mg | ORAL_TABLET | Freq: Every day | ORAL | Status: DC

## 2013-10-19 NOTE — Telephone Encounter (Signed)
Pt called and needs refill on Allopurinol 300 mg to [facility].  Pt has had recent cpe.

## 2013-10-19 NOTE — Telephone Encounter (Signed)
Done

## 2013-12-21 ENCOUNTER — Other Ambulatory Visit: Payer: Self-pay | Admitting: *Deleted

## 2013-12-21 ENCOUNTER — Telehealth: Payer: Self-pay | Admitting: Family Medicine

## 2013-12-21 DIAGNOSIS — I10 Essential (primary) hypertension: Secondary | ICD-10-CM

## 2013-12-21 MED ORDER — INDAPAMIDE 2.5 MG PO TABS: 2.5000 mg | ORAL_TABLET | ORAL | Status: DC

## 2013-12-21 NOTE — Telephone Encounter (Signed)
Done. Durene Cal for #90 to [facility].

## 2013-12-25 NOTE — Telephone Encounter (Signed)
done

## 2014-01-04 ENCOUNTER — Telehealth: Payer: Self-pay | Admitting: *Deleted

## 2014-01-04 ENCOUNTER — Ambulatory Visit (INDEPENDENT_AMBULATORY_CARE_PROVIDER_SITE_OTHER): Payer: BC Managed Care – PPO | Admitting: Family Medicine

## 2014-01-04 ENCOUNTER — Encounter: Payer: Self-pay | Admitting: Family Medicine

## 2014-01-04 VITALS — BP 140/86 | HR 84 | Ht 61.0 in | Wt 120.0 lb

## 2014-01-04 DIAGNOSIS — E7800 Pure hypercholesterolemia, unspecified: Secondary | ICD-10-CM

## 2014-01-04 DIAGNOSIS — M109 Gout, unspecified: Secondary | ICD-10-CM

## 2014-01-04 DIAGNOSIS — Z0000 Encounter for general adult medical examination without abnormal findings: Secondary | ICD-10-CM

## 2014-01-04 DIAGNOSIS — E876 Hypokalemia: Secondary | ICD-10-CM

## 2014-01-04 DIAGNOSIS — Z79890 Hormone replacement therapy (postmenopausal): Secondary | ICD-10-CM | POA: Insufficient documentation

## 2014-01-04 DIAGNOSIS — Z79899 Other long term (current) drug therapy: Secondary | ICD-10-CM

## 2014-01-04 DIAGNOSIS — I10 Essential (primary) hypertension: Secondary | ICD-10-CM

## 2014-01-04 DIAGNOSIS — R945 Abnormal results of liver function studies: Secondary | ICD-10-CM

## 2014-01-04 DIAGNOSIS — D649 Anemia, unspecified: Secondary | ICD-10-CM

## 2014-01-04 DIAGNOSIS — R7989 Other specified abnormal findings of blood chemistry: Secondary | ICD-10-CM

## 2014-01-04 DIAGNOSIS — F17200 Nicotine dependence, unspecified, uncomplicated: Secondary | ICD-10-CM

## 2014-01-04 LAB — CBC WITH DIFFERENTIAL/PLATELET
BASOS ABS: 0 10*3/uL (ref 0.0–0.1)
BASOS PCT: 0 % (ref 0–1)
Eosinophils Absolute: 0 10*3/uL (ref 0.0–0.7)
Eosinophils Relative: 0 % (ref 0–5)
HCT: 34 % — ABNORMAL LOW (ref 36.0–46.0)
Hemoglobin: 11.5 g/dL — ABNORMAL LOW (ref 12.0–15.0)
LYMPHS PCT: 18 % (ref 12–46)
Lymphs Abs: 1 10*3/uL (ref 0.7–4.0)
MCH: 33 pg (ref 26.0–34.0)
MCHC: 33.8 g/dL (ref 30.0–36.0)
MCV: 97.7 fL (ref 78.0–100.0)
Monocytes Absolute: 0.4 10*3/uL (ref 0.1–1.0)
Monocytes Relative: 8 % (ref 3–12)
NEUTROS ABS: 3.9 10*3/uL (ref 1.7–7.7)
NEUTROS PCT: 74 % (ref 43–77)
PLATELETS: 204 10*3/uL (ref 150–400)
RBC: 3.48 MIL/uL — ABNORMAL LOW (ref 3.87–5.11)
RDW: 13.4 % (ref 11.5–15.5)
WBC: 5.3 10*3/uL (ref 4.0–10.5)

## 2014-01-04 LAB — COMPREHENSIVE METABOLIC PANEL
ALBUMIN: 4 g/dL (ref 3.5–5.2)
ALT: 25 U/L (ref 0–35)
AST: 70 U/L — AB (ref 0–37)
Alkaline Phosphatase: 94 U/L (ref 39–117)
BUN: 6 mg/dL (ref 6–23)
CALCIUM: 9.9 mg/dL (ref 8.4–10.5)
CHLORIDE: 95 meq/L — AB (ref 96–112)
CO2: 32 meq/L (ref 19–32)
CREATININE: 0.48 mg/dL — AB (ref 0.50–1.10)
Glucose, Bld: 89 mg/dL (ref 70–99)
Potassium: 3.2 mEq/L — ABNORMAL LOW (ref 3.5–5.3)
Sodium: 137 mEq/L (ref 135–145)
Total Bilirubin: 0.4 mg/dL (ref 0.2–1.2)
Total Protein: 7.4 g/dL (ref 6.0–8.3)

## 2014-01-04 NOTE — Telephone Encounter (Signed)
Future orders entered. 

## 2014-01-04 NOTE — Patient Instructions (Signed)
Please set a quit date for smoking (consider Memorial Day or July 4th) and let your family know so they can support you.  Do not have cigarettes around the house. Please try and quit smoking--start thinking about why/when you smoke (habit, boredom, stress) in order to come up with effective strategies to cut back or quit. Available resources to help you quit include free counseling through [facility] Quitline ([place]uitline.com or 1-800-QUITNOW), smoking cessation classes through [facility] (call to find out schedule), over-the-counter nicotine replacements, and e-cigarettes (although this may not help break the hand-mouth habit).  Many insurance companies also have smoking cessation programs (which may decrease the cost of patches, meds if enrolled).  If these methods are not effective for you, and you are motivated to quit, return to discuss the possibility of prescription medications.  Please continue to taper back on the use of the hormone replacement (try every 3 weeks, then once a month, then stopping completely).  Restart back at the lowest effective dose (currently is every other week) if you develop severe menopausal symptoms, and can't tolerate cutting back further.  Ideally, I would like for you to taper off completely.  Look into the cost of shingles vaccine--your out of pocket expense might be significantly higher if you wait until you are 65 and on Medicare (and you will have to get from the pharmacy rather than my office).  You can set up a nurse visit to have the vaccine if covered and desired.

## 2014-01-04 NOTE — Telephone Encounter (Signed)
Patient scheduled CPE for 06/11/14-she would like to come in for labs a few days before-can you let me know what needs to be ordered, thanks.

## 2014-01-04 NOTE — Progress Notes (Signed)
Chief Complaint  Patient presents with  . Hypertension    nonfasting med check-6 month follow up.    Hypertension follow-up:  Blood pressures elsewhere are 140/low 80's.  Denies dizziness, headaches, chest pain.  Denies side effects of medications.  Hyperlipidemia follow-up:  Patient is reportedly following a low-fat, low cholesterol diet.  Compliant with medications and denies medication side effects  Gout:  Denies any flares.  Compliant with allopurinol, and previously expressed that she is not interested in trying to change to a lower dose (uric acid level was low).  Smoking:  She can often go all day without smoking, sometimes just has 3-4/day, only smoker in her garage.  Not in the house, not at work, not at [employer].  She is going through some problems with her mom, and feels like she uses this as a coping mechanism  Past Medical History  Diagnosis Date  . COPD (chronic obstructive pulmonary disease)   . Gouty arthropathy     R foot  . LBP (low back pain)   . Dietary surveillance and counseling   . Prediabetes   . Vitamin D deficiency   . Hypertension     controlled  . Hyperlipidemia 1997  . Glaucoma     Dr. Ellie Lunch  . Hx of adenomatous colonic polyps 2012    Dr. Earlean Shawl  . Internal hemorrhoids 2012  . Diverticulosis 2012  . Postmenopausal   . Osteopenia 04/2010    (DEXA ordered by Dr. Ubaldo Glassing, scheduled for 04/2012)  . Smoker    Past Surgical History  Procedure Laterality Date  . Appendectomy  1966  . Tubal ligation  1980  . Colonoscopy  03/2011    Dr. Earlean Shawl  . Band hemorrhoidectomy  06/2011    Dr. Earlean Shawl   History   Social History  . Marital Status: Married    Spouse Name: N/A    Number of Children: 2  . Years of Education: N/A   Occupational History  . retired Transport planner)    Social History Main Topics  . Smoking status: Current Every Day Smoker -- 0.50 packs/day for 40 years    Types: Cigarettes  . Smokeless tobacco: Never Used     Comment:  cutting back; preparing to quit  . Alcohol Use: Yes     Comment: 2 drinks per week.  . Drug Use: No  . Sexual Activity: Yes    Partners: Male   Other Topics Concern  . Not on file   Social History Narrative   Lives with husband.  1 daughter in [place], the other daughter lives down the street.    Outpatient Encounter Prescriptions as of 01/04/2014  Medication Sig Note  . allopurinol (ZYLOPRIM) 300 MG tablet Take 1 tablet (300 mg total) by mouth daily.   . Calcium Carbonate-Vitamin D (CALCIUM 600+D) 600-400 MG-UNIT per tablet Take 1 tablet by mouth daily.   . cholecalciferol (VITAMIN D) 1000 UNITS tablet Take 1,000 Units by mouth daily.   . dorzolamide-timolol (COSOPT) 22.3-6.8 MG/ML ophthalmic solution Place 1 drop into both eyes 2 (two) times daily.   . Estradiol 10 MCG TABS vaginal tablet Place 1 tablet (10 mcg total) vaginally once a week. 01/04/2014: Cut back to every other week per Dr.Humberto Addo  . indapamide (LOZOL) 2.5 MG tablet Take 1 tablet (2.5 mg total) by mouth every morning.   . potassium chloride 20 MEQ/15ML (10%) solution Take 15 mLs (20 mEq total) by mouth daily.   . simvastatin (ZOCOR) 40 MG tablet Take  1 tablet (40 mg total) by mouth every evening.    No Known Allergies  ROS: denies fevers, chills, URI symptoms, allergy symptoms, headaches, dizziness, chest pain, palpitations, nausea, vomiting, bowel changes, rashes, bleeding/bruising.  Denies urinary complaints.  Denies any falls  PHYSICAL EXAM: BP 140/86  Pulse 84  Ht 5' 1"  (1.549 m)  Wt 120 lb (54.432 kg)  BMI 22.69 kg/m2 Well developed, pleasant female in no distress Neck: no lymphadenopathy, thyromegaly or bruit Heart: regular rate and rhythm Lungs: Clear bilaterally, no wheezes, rales, ronchi Abdomen: soft, nontender, no organomegaly or mass Extremities: no edema Psych: normal mood, affect, hygiene and grooming Neuro: alert and oriented.  Cranial nerves intact. Normal gait. She had a little trouble getting up on  the exam table--she needed assistance, was anxious that she would fall (denied dizziness, weakness).  Walks up the stairs from her garage daily without any problems.  No h/o falls.  ASSESSMENT/PLAN:  Essential hypertension, benign - borderline control.  continue current meds; daily exercise; low sodium diet; continue monitoring - Plan: Comprehensive metabolic panel  Tobacco use disorder  Pure hypercholesterolemia  Hypokalemia - Plan: Comprehensive metabolic panel  Elevated liver function tests - Plan: Comprehensive metabolic panel  Anemia - Plan: CBC with Differential  Hormone replacement therapy - risks reviewed, and encouraged further tapering down, with goal of getting off entirely  HTN--borderline systolic.  Reviewed low sodium diet, continue monitoring. c-met and CBC today  Recommended that she further taper off HRT Quit smoking--counseled extensively.  Encouraged to set quit date; develop other coping mechanisms.  Counseled x 5-10 mins.  Discussed zostavax and prevnar-13.  Declines both today.  I didn't recommend waiting on zostavax until next physical, as she will be on Medicare then; coverage/cost may be better on current insurance.  Advised that she will need prevnar at her 63 yo check up (next visit)  F/u 6 mos--CPE/med check.  Depending on the type of insurance/medicare she gets, might be welcome to medicare physical

## 2014-01-05 NOTE — Addendum Note (Signed)
Addended by: Pernell Dupre A on: 01/05/2014 11:43 AM   Modules accepted: Orders

## 2014-01-22 ENCOUNTER — Other Ambulatory Visit: Payer: BC Managed Care – PPO

## 2014-01-22 DIAGNOSIS — I10 Essential (primary) hypertension: Secondary | ICD-10-CM

## 2014-01-22 DIAGNOSIS — Z23 Encounter for immunization: Secondary | ICD-10-CM

## 2014-01-22 DIAGNOSIS — M109 Gout, unspecified: Secondary | ICD-10-CM

## 2014-01-22 DIAGNOSIS — E876 Hypokalemia: Secondary | ICD-10-CM

## 2014-01-22 DIAGNOSIS — Z79899 Other long term (current) drug therapy: Secondary | ICD-10-CM

## 2014-01-22 DIAGNOSIS — D649 Anemia, unspecified: Secondary | ICD-10-CM

## 2014-01-22 DIAGNOSIS — E7800 Pure hypercholesterolemia, unspecified: Secondary | ICD-10-CM

## 2014-01-22 DIAGNOSIS — R7989 Other specified abnormal findings of blood chemistry: Secondary | ICD-10-CM

## 2014-01-22 DIAGNOSIS — Z0000 Encounter for general adult medical examination without abnormal findings: Secondary | ICD-10-CM

## 2014-01-22 DIAGNOSIS — R945 Abnormal results of liver function studies: Secondary | ICD-10-CM

## 2014-01-22 LAB — CBC WITH DIFFERENTIAL/PLATELET
BASOS ABS: 0 10*3/uL (ref 0.0–0.1)
Basophils Relative: 0 % (ref 0–1)
EOS PCT: 1 % (ref 0–5)
Eosinophils Absolute: 0.1 10*3/uL (ref 0.0–0.7)
HCT: 32.2 % — ABNORMAL LOW (ref 36.0–46.0)
Hemoglobin: 10.9 g/dL — ABNORMAL LOW (ref 12.0–15.0)
LYMPHS PCT: 21 % (ref 12–46)
Lymphs Abs: 1.3 10*3/uL (ref 0.7–4.0)
MCH: 33.1 pg (ref 26.0–34.0)
MCHC: 33.9 g/dL (ref 30.0–36.0)
MCV: 97.9 fL (ref 78.0–100.0)
Monocytes Absolute: 0.4 10*3/uL (ref 0.1–1.0)
Monocytes Relative: 6 % (ref 3–12)
NEUTROS ABS: 4.3 10*3/uL (ref 1.7–7.7)
Neutrophils Relative %: 72 % (ref 43–77)
Platelets: 265 10*3/uL (ref 150–400)
RBC: 3.29 MIL/uL — ABNORMAL LOW (ref 3.87–5.11)
RDW: 13.1 % (ref 11.5–15.5)
WBC: 6 10*3/uL (ref 4.0–10.5)

## 2014-01-23 LAB — LIPID PANEL
CHOLESTEROL: 105 mg/dL (ref 0–200)
HDL: 39 mg/dL — ABNORMAL LOW (ref >39)
LDL Cholesterol: 56 mg/dL (ref 0–99)
Total CHOL/HDL Ratio: 2.7 Ratio
Triglycerides: 50 mg/dL (ref <150)
VLDL: 10 mg/dL (ref 0–40)

## 2014-01-23 LAB — COMPREHENSIVE METABOLIC PANEL
ALT: 20 U/L (ref 0–35)
AST: 49 U/L — ABNORMAL HIGH (ref 0–37)
Albumin: 3.9 g/dL (ref 3.5–5.2)
Alkaline Phosphatase: 83 U/L (ref 39–117)
BUN: 8 mg/dL (ref 6–23)
CO2: 28 meq/L (ref 19–32)
Calcium: 9.5 mg/dL (ref 8.4–10.5)
Chloride: 97 mEq/L (ref 96–112)
Creat: 0.45 mg/dL — ABNORMAL LOW (ref 0.50–1.10)
GLUCOSE: 89 mg/dL (ref 70–99)
POTASSIUM: 3.7 meq/L (ref 3.5–5.3)
SODIUM: 133 meq/L — AB (ref 135–145)
TOTAL PROTEIN: 6.9 g/dL (ref 6.0–8.3)
Total Bilirubin: 0.4 mg/dL (ref 0.2–1.2)

## 2014-01-23 LAB — URIC ACID: Uric Acid, Serum: 2.6 mg/dL (ref 2.4–7.0)

## 2014-01-23 LAB — TSH: TSH: 1.877 u[IU]/mL (ref 0.350–4.500)

## 2014-02-05 ENCOUNTER — Encounter: Payer: Self-pay | Admitting: Family Medicine

## 2014-02-05 ENCOUNTER — Ambulatory Visit (INDEPENDENT_AMBULATORY_CARE_PROVIDER_SITE_OTHER): Payer: BC Managed Care – PPO | Admitting: Family Medicine

## 2014-02-05 VITALS — BP 130/70 | HR 60 | Ht 60.0 in | Wt 120.0 lb

## 2014-02-05 DIAGNOSIS — E876 Hypokalemia: Secondary | ICD-10-CM

## 2014-02-05 DIAGNOSIS — D649 Anemia, unspecified: Secondary | ICD-10-CM

## 2014-02-05 DIAGNOSIS — F17200 Nicotine dependence, unspecified, uncomplicated: Secondary | ICD-10-CM

## 2014-02-05 DIAGNOSIS — E7800 Pure hypercholesterolemia, unspecified: Secondary | ICD-10-CM

## 2014-02-05 DIAGNOSIS — I10 Essential (primary) hypertension: Secondary | ICD-10-CM

## 2014-02-05 DIAGNOSIS — M109 Gout, unspecified: Secondary | ICD-10-CM

## 2014-02-05 NOTE — Progress Notes (Signed)
Chief Complaint  Patient presents with  . 1 month f/u    pt states she is here for 1 month f/u and denies any problems or concerns    Patient isn't sure why she is being seen today.  Review of the chart shows that she was due for a repeat c-met (only--only asked for labs).  However, it appears that a f/u appt was also schedule (?), and when she came for labs, all of her labs for her next visit/CPE were released, rather than just c-met.  She was supposed to have c-met repeated due to an elevation in liver test, and hypokalemia.  She had admitted that she hadn't been taking her potassium daily, and this was a recheck after being more compliant with her meds.  She denies any muscle cramps.  Her other medical problems are stable, as reported at last visit--BP doing well, no side effects from lipid meds, no gout flares. We had elected to check uric acid and lipids just once yearly, since had been stable, but they were done in error with her recent labs.  As expected, they were within goal range.  Past Medical History  Diagnosis Date  . COPD (chronic obstructive pulmonary disease)   . Gouty arthropathy     R foot  . LBP (low back pain)   . Dietary surveillance and counseling   . Prediabetes   . Vitamin D deficiency   . Hypertension     controlled  . Hyperlipidemia 1997  . Glaucoma     Dr. Ellie Lunch  . Hx of adenomatous colonic polyps 2012    Dr. Earlean Shawl  . Internal hemorrhoids 2012  . Diverticulosis 2012  . Postmenopausal   . Osteopenia 04/2010    (DEXA ordered by Dr. Ubaldo Glassing, scheduled for 04/2012)  . Smoker    Past Surgical History  Procedure Laterality Date  . Appendectomy  1966  . Tubal ligation  1980  . Colonoscopy  03/2011    Dr. Earlean Shawl  . Band hemorrhoidectomy  06/2011    Dr. Earlean Shawl   History   Social History  . Marital Status: Married    Spouse Name: N/A    Number of Children: 2  . Years of Education: N/A   Occupational History  . retired Transport planner)    Social  History Main Topics  . Smoking status: Current Every Day Smoker -- 0.50 packs/day for 40 years    Types: Cigarettes  . Smokeless tobacco: Never Used     Comment: cutting back; preparing to quit  . Alcohol Use: Yes     Comment: 2 drinks per week.  . Drug Use: No  . Sexual Activity: Yes    Partners: Male   Other Topics Concern  . Not on file   Social History Narrative   Lives with husband.  1 daughter in [place], the other daughter lives down the street.   Outpatient Encounter Prescriptions as of 02/05/2014  Medication Sig Note  . allopurinol (ZYLOPRIM) 300 MG tablet Take 1 tablet (300 mg total) by mouth daily.   . Calcium Carbonate-Vitamin D (CALCIUM 600+D) 600-400 MG-UNIT per tablet Take 1 tablet by mouth daily.   . cholecalciferol (VITAMIN D) 1000 UNITS tablet Take 1,000 Units by mouth daily.   . dorzolamide-timolol (COSOPT) 22.3-6.8 MG/ML ophthalmic solution Place 1 drop into both eyes 2 (two) times daily.   . Estradiol 10 MCG TABS vaginal tablet Place 1 tablet (10 mcg total) vaginally once a week. 01/04/2014: Cut back to every other  week per Dr.Aden Sek  . indapamide (LOZOL) 2.5 MG tablet Take 1 tablet (2.5 mg total) by mouth every morning.   . potassium chloride 20 MEQ/15ML (10%) solution Take 15 mLs (20 mEq total) by mouth daily.   . simvastatin (ZOCOR) 40 MG tablet Take 1 tablet (40 mg total) by mouth every evening.    No Known Allergies  ROS:  Denies fevers, chills, headaches, chest pain, URI symptoms, shortness of breath, GI complaints, rashes, bleeding, bruising or other concerns.  No joint pains.  PHYSICAL EXAM: BP 130/70  Pulse 60  Ht 5' (1.524 m)  Wt 120 lb (54.432 kg)  BMI 23.44 kg/m2 Well developed, pleasant female in no distress Labs reviewed with patient:    Chemistry      Component Value Date/Time   NA 133* 01/22/2014 1023   K 3.7 01/22/2014 1023   CL 97 01/22/2014 1023   CO2 28 01/22/2014 1023   BUN 8 01/22/2014 1023   CREATININE 0.45* 01/22/2014 1023      Component  Value Date/Time   CALCIUM 9.5 01/22/2014 1023   ALKPHOS 83 01/22/2014 1023   AST 49* 01/22/2014 1023   ALT 20 01/22/2014 1023   BILITOT 0.4 01/22/2014 1023     Glucose 89 Uric acid 2.6  Lab Results  Component Value Date   TSH 1.877 01/22/2014   Lab Results  Component Value Date   WBC 6.0 01/22/2014   HGB 10.9* 01/22/2014   HCT 32.2* 01/22/2014   MCV 97.9 01/22/2014   PLT 265 01/22/2014    ASSESSMENT/PLAN:  Hypokalemia - resolved when compliant with taking potassium replacement  Essential hypertension, benign - controlled  Tobacco use disorder - encouraged cessation  Gouty arthropathy - uric acid remains low, no gout flares  Pure hypercholesterolemia - well controlled  Labs done early, in error.  Will not need to have TSH, lipids or uric acid repeated at her physical.  Repeat CBC and c-met have been reordered.  zostavax--schedule a nurse visit when 1 month (minimum) from the pneumonia vaccine.--after 6/4  F/u as scheduled in September for CPE, sooner prn.

## 2014-02-05 NOTE — Patient Instructions (Signed)
Continue your daily potassium. Return after 6/4 for Zostavax.  See you as scheduled in September, sooner if there are any problem or concerns.

## 2014-02-06 ENCOUNTER — Encounter: Payer: Self-pay | Admitting: Family Medicine

## 2014-03-29 ENCOUNTER — Telehealth: Payer: Self-pay | Admitting: Internal Medicine

## 2014-03-29 DIAGNOSIS — I10 Essential (primary) hypertension: Secondary | ICD-10-CM

## 2014-03-29 MED ORDER — INDAPAMIDE 2.5 MG PO TABS: 2.5000 mg | ORAL_TABLET | ORAL | Status: DC

## 2014-03-29 NOTE — Telephone Encounter (Signed)
Pt needs a refill on indapamide 2.5mg  to [facility]

## 2014-03-29 NOTE — Telephone Encounter (Signed)
done

## 2014-04-12 ENCOUNTER — Other Ambulatory Visit: Payer: Self-pay

## 2014-04-12 DIAGNOSIS — Z1231 Encounter for screening mammogram for malignant neoplasm of breast: Secondary | ICD-10-CM

## 2014-05-10 ENCOUNTER — Telehealth: Payer: Self-pay | Admitting: Family Medicine

## 2014-05-10 MED ORDER — ALLOPURINOL 300 MG PO TABS: 300.0000 mg | ORAL_TABLET | Freq: Every day | ORAL | Status: DC

## 2014-05-10 NOTE — Telephone Encounter (Signed)
rx sent

## 2014-05-10 NOTE — Telephone Encounter (Signed)
Pt called for refill of Allopurinol to [facility].

## 2014-05-17 ENCOUNTER — Ambulatory Visit
Admission: RE | Admit: 2014-05-17 | Discharge: 2014-05-17 | Disposition: A | Discharge status: Home / Self Care | Payer: BC Managed Care – PPO | Source: Ambulatory Visit

## 2014-05-17 DIAGNOSIS — Z1231 Encounter for screening mammogram for malignant neoplasm of breast: Secondary | ICD-10-CM

## 2014-06-07 ENCOUNTER — Other Ambulatory Visit: Payer: BC Managed Care – PPO

## 2014-06-07 DIAGNOSIS — E876 Hypokalemia: Secondary | ICD-10-CM

## 2014-06-07 DIAGNOSIS — D649 Anemia, unspecified: Secondary | ICD-10-CM

## 2014-06-07 DIAGNOSIS — I10 Essential (primary) hypertension: Secondary | ICD-10-CM

## 2014-06-07 LAB — CBC WITH DIFFERENTIAL/PLATELET
BASOS ABS: 0 10*3/uL (ref 0.0–0.1)
BASOS PCT: 0 % (ref 0–1)
Eosinophils Absolute: 0.1 10*3/uL (ref 0.0–0.7)
Eosinophils Relative: 1 % (ref 0–5)
HCT: 34.4 % — ABNORMAL LOW (ref 36.0–46.0)
Hemoglobin: 11.9 g/dL — ABNORMAL LOW (ref 12.0–15.0)
LYMPHS PCT: 21 % (ref 12–46)
Lymphs Abs: 1.4 10*3/uL (ref 0.7–4.0)
MCH: 33.1 pg (ref 26.0–34.0)
MCHC: 34.6 g/dL (ref 30.0–36.0)
MCV: 95.8 fL (ref 78.0–100.0)
MONO ABS: 0.4 10*3/uL (ref 0.1–1.0)
Monocytes Relative: 6 % (ref 3–12)
NEUTROS PCT: 72 % (ref 43–77)
Neutro Abs: 5 10*3/uL (ref 1.7–7.7)
PLATELETS: 239 10*3/uL (ref 150–400)
RBC: 3.59 MIL/uL — ABNORMAL LOW (ref 3.87–5.11)
RDW: 13.2 % (ref 11.5–15.5)
WBC: 6.9 10*3/uL (ref 4.0–10.5)

## 2014-06-07 LAB — COMPREHENSIVE METABOLIC PANEL
ALBUMIN: 4.1 g/dL (ref 3.5–5.2)
ALK PHOS: 80 U/L (ref 39–117)
ALT: 16 U/L (ref 0–35)
AST: 33 U/L (ref 0–37)
BUN: 9 mg/dL (ref 6–23)
CHLORIDE: 99 meq/L (ref 96–112)
CO2: 27 mEq/L (ref 19–32)
Calcium: 9.6 mg/dL (ref 8.4–10.5)
Creat: 0.54 mg/dL (ref 0.50–1.10)
Glucose, Bld: 81 mg/dL (ref 70–99)
POTASSIUM: 3.5 meq/L (ref 3.5–5.3)
Sodium: 137 mEq/L (ref 135–145)
Total Bilirubin: 0.4 mg/dL (ref 0.2–1.2)
Total Protein: 7.4 g/dL (ref 6.0–8.3)

## 2014-06-11 ENCOUNTER — Encounter: Payer: Self-pay | Admitting: Family Medicine

## 2014-06-11 ENCOUNTER — Ambulatory Visit (INDEPENDENT_AMBULATORY_CARE_PROVIDER_SITE_OTHER): Payer: BC Managed Care – PPO | Admitting: Family Medicine

## 2014-06-11 VITALS — BP 136/86 | HR 64 | Ht 61.0 in | Wt 127.0 lb

## 2014-06-11 DIAGNOSIS — Z0000 Encounter for general adult medical examination without abnormal findings: Secondary | ICD-10-CM

## 2014-06-11 DIAGNOSIS — M949 Disorder of cartilage, unspecified: Secondary | ICD-10-CM

## 2014-06-11 DIAGNOSIS — M8580 Other specified disorders of bone density and structure, unspecified site: Secondary | ICD-10-CM

## 2014-06-11 DIAGNOSIS — E876 Hypokalemia: Secondary | ICD-10-CM

## 2014-06-11 DIAGNOSIS — D649 Anemia, unspecified: Secondary | ICD-10-CM

## 2014-06-11 DIAGNOSIS — M899 Disorder of bone, unspecified: Secondary | ICD-10-CM

## 2014-06-11 DIAGNOSIS — E7800 Pure hypercholesterolemia, unspecified: Secondary | ICD-10-CM

## 2014-06-11 DIAGNOSIS — F17200 Nicotine dependence, unspecified, uncomplicated: Secondary | ICD-10-CM

## 2014-06-11 DIAGNOSIS — M109 Gout, unspecified: Secondary | ICD-10-CM

## 2014-06-11 DIAGNOSIS — E559 Vitamin D deficiency, unspecified: Secondary | ICD-10-CM

## 2014-06-11 DIAGNOSIS — Z79890 Hormone replacement therapy (postmenopausal): Secondary | ICD-10-CM

## 2014-06-11 DIAGNOSIS — I10 Essential (primary) hypertension: Secondary | ICD-10-CM

## 2014-06-11 DIAGNOSIS — R7303 Prediabetes: Secondary | ICD-10-CM

## 2014-06-11 DIAGNOSIS — R7309 Other abnormal glucose: Secondary | ICD-10-CM

## 2014-06-11 MED ORDER — SIMVASTATIN 40 MG PO TABS: 40.0000 mg | ORAL_TABLET | Freq: Every evening | ORAL | Status: DC

## 2014-06-11 MED ORDER — POTASSIUM CHLORIDE 20 MEQ/15ML (10%) PO LIQD: 20.0000 meq | Freq: Every day | ORAL | Status: DC

## 2014-06-11 MED ORDER — INDAPAMIDE 2.5 MG PO TABS: 2.5000 mg | ORAL_TABLET | ORAL | Status: DC

## 2014-06-11 NOTE — Patient Instructions (Signed)
  HEALTH MAINTENANCE RECOMMENDATIONS:  It is recommended that you get at least 30 minutes of aerobic exercise at least 5 days/week (for weight loss, you may need as much as 60-90 minutes). This can be any activity that gets your heart rate up. This can be divided in 10-15 minute intervals if needed, but try and build up your endurance at least once a week.  Weight bearing exercise is also recommended twice weekly.  Eat a healthy diet with lots of vegetables, fruits and fiber.  "Colorful" foods have a lot of vitamins (ie green vegetables, tomatoes, red peppers, etc).  Limit sweet tea, regular sodas and alcoholic beverages, all of which has a lot of calories and sugar.  Up to 1 alcoholic drink daily may be beneficial for women (unless trying to lose weight, watch sugars).  Drink a lot of water.  Calcium recommendations are 1200-1500 mg daily (1500 mg for postmenopausal women or women without ovaries), and vitamin D 1000 IU daily.  This should be obtained from diet and/or supplements (vitamins), and calcium should not be taken all at once, but in divided doses.  Monthly self breast exams and yearly mammograms for women over the age of 74 is recommended.  Sunscreen of at least SPF 30 should be used on all sun-exposed parts of the skin when outside between the hours of 10 am and 4 pm (not just when at beach or pool, but even with exercise, golf, tennis, and yard work!)  Use a sunscreen that says "broad spectrum" so it covers both UVA and UVB rays, and make sure to reapply every 1-2 hours.  Remember to change the batteries in your smoke detectors when changing your clock times in the spring and fall.  Use your seat belt every time you are in a car, and please drive safely and not be distracted with cell phones and texting while driving.   Please try and quit smoking--start thinking about why/when you smoke (habit, boredom, stress) in order to come up with effective strategies to cut back or quit.  Available resources to help you quit include free counseling through [facility]uitline ([place]Quitline.com or 1-800-QUITNOW), smoking cessation classes through [facility] (call to find out schedule), over-the-counter nicotine replacements, and e-cigarettes (although this may not help break the hand-mouth habit).  Many insurance companies also have smoking cessation programs (which may decrease the cost of patches, meds if enrolled).  If these methods are not effective for you, and you are motivated to quit, return to discuss the possibility of prescription medications.  Remember--you are setting a quit date of 08/07/2104. Please try and stick to it!  Stop using the Vagifem.  Call if you have problems after being off it it (to restart at a lower dose/less frequent use).

## 2014-06-11 NOTE — Progress Notes (Signed)
Chief Complaint  Patient presents with  . cpe/ med check    cpe/ med check. labs were done thursday. no breast or pelvic today. declines flu shot today. she gets eyes checked every 6 months, no other concerns   Bailey Parker is a 63 y.o. female who presents for a complete physical.  She has the following concerns:  Hypertension follow-up: Blood pressures elsewhere are 140/low 80's. Denies dizziness, headaches, chest pain, edema, muscle cramps. Denies side effects of medications.   Hyperlipidemia follow-up: Patient is reportedly following a low-fat, low cholesterol diet. Compliant with medications and denies medication side effects   Gout: Denies any flares. Compliant with allopurinol (previously expressed that she is not interested in trying to change to a lower dose (uric acid level was low)).   Smoking: She can often go all day without smoking, sometimes just has 3-4/day, up to 6/day, only smoking in her garage.  Plans to try and quit by her daughter's birthday, 08/07/14.  HRT: She cut back to using the vagifem just every other week.  She has not noticed any hot flashes, vaginal dryness or pain since cutting back.  She has a living will and healthcare power of attorney.  Immunization History  Administered Date(s) Administered  . Pneumococcal Conjugate-13 01/22/2014  . Td 08/03/1995  . Tdap 02/02/2006  zostavax is not covered by her insurance; she is considering it still Last Pap smear: 05/12/12  Last mammogram: 04/2014  Last colonoscopy: 04/16/2011, due again 2017 Last DEXA: 2011 per Dr. Nestor Ramp are in old chart, and show T-1.1 at femur (unchanged from 2004). Dentist: twice yearly  Ophtho: twice yearly  Exercise: She is walking 15 minutes 3x/week in the neighborhood. She just bought some weights, plans to start.  Past Medical History  Diagnosis Date  . COPD (chronic obstructive pulmonary disease)   . Gouty arthropathy     R foot  . LBP (low back pain)   . Dietary  surveillance and counseling   . Prediabetes   . Vitamin D deficiency   . Hypertension     controlled  . Hyperlipidemia 1997  . Glaucoma     Dr. Ellie Lunch  . Hx of adenomatous colonic polyps 2012    Dr. Earlean Shawl  . Internal hemorrhoids 2012  . Diverticulosis 2012  . Postmenopausal   . Osteopenia 04/2010    (DEXA ordered by Dr. Ubaldo Glassing, scheduled for 04/2012)  . Smoker     Past Surgical History  Procedure Laterality Date  . Appendectomy  1966  . Tubal ligation  1980  . Colonoscopy  03/2011    Dr. Earlean Shawl  . Band hemorrhoidectomy  06/2011    Dr. Earlean Shawl    History   Social History  . Marital Status: Married    Spouse Name: N/A    Number of Children: 2  . Years of Education: N/A   Occupational History  . retired Transport planner)    Social History Main Topics  . Smoking status: Current Every Day Smoker -- 0.50 packs/day for 40 years    Types: Cigarettes  . Smokeless tobacco: Never Used     Comment: cut back to 6 cigarettes/day (05/2014)  . Alcohol Use: Yes     Comment: 2 drinks per week.  . Drug Use: No  . Sexual Activity: Yes    Partners: Male   Other Topics Concern  . Not on file   Social History Narrative   Lives with husband.  1 daughter in [place], the other daughter lives down  the street.    Family History  Problem Relation Age of Onset  . Alzheimer's disease Mother   . Hypertension Mother   . Heart disease Father   . Diabetes Father   . Hypertension Brother   . Diabetes Brother   . Cancer Neg Hx    Outpatient Encounter Prescriptions as of 06/11/2014  Medication Sig Note  . allopurinol (ZYLOPRIM) 300 MG tablet Take 1 tablet (300 mg total) by mouth daily.   Marland Kitchen aspirin 81 MG tablet Take 81 mg by mouth daily.   . Calcium Carbonate-Vitamin D (CALCIUM 600+D) 600-400 MG-UNIT per tablet Take 1 tablet by mouth daily.   . cholecalciferol (VITAMIN D) 1000 UNITS tablet Take 1,000 Units by mouth daily.   . dorzolamide-timolol (COSOPT) 22.3-6.8 MG/ML ophthalmic solution Place 1  drop into both eyes 2 (two) times daily.   . Estradiol 10 MCG TABS vaginal tablet Place 1 tablet (10 mcg total) vaginally once a week. 01/04/2014: Cut back to every other week per Dr.Neve Branscomb  . indapamide (LOZOL) 2.5 MG tablet Take 1 tablet (2.5 mg total) by mouth every morning.   . potassium chloride 20 MEQ/15ML (10%) solution Take 15 mLs (20 mEq total) by mouth daily.   . simvastatin (ZOCOR) 40 MG tablet Take 1 tablet (40 mg total) by mouth every evening.      No Known Allergies  ROS: The patient denies anorexia, fever, weight changes, headaches, vision changes, decreased hearing, ear pain, sore throat, breast concerns, chest pain, palpitations, dizziness, syncope, dyspnea on exertion, cough, swelling, nausea, vomiting, diarrhea, constipation, abdominal pain, melena, hematochezia, indigestion/heartburn, hematuria, incontinence, dysuria, vaginal bleeding, discharge, odor or itch, genital lesions, joint pains, numbness, tingling, weakness, tremor, suspicious skin lesions, depression, anxiety, abnormal bleeding/bruising, or enlarged lymph nodes. Only occasional low back pain.  PHYSICAL EXAM: BP 136/86  Pulse 64  Ht 5\' 1"  (1.549 m)  Wt 127 lb (57.607 kg)  BMI 24.01 kg/m2  General Appearance:  Alert, cooperative, no distress, appears stated age   Head:  Normocephalic, without obvious abnormality, atraumatic   Eyes:  PERRL, conjunctiva/corneas clear, EOM's intact, fundi  benign   Ears:  Normal TM's and external ear canals   Nose:  Nares normal, mucosa normal, no drainage or sinus tenderness   Throat:  Lips, mucosa, and tongue normal; teeth and gums normal. Torus pallatini   Neck:  Supple, no lymphadenopathy; thyroid: no enlargement/tenderness/nodules; no carotid  bruit or JVD   Back:  Spine nontender, no curvature, ROM normal, no CVA tenderness   Lungs:  Clear to auscultation bilaterally without wheezes, rales or ronchi; respirations unlabored   Chest Wall:  No tenderness or deformity    Heart:  Regular rate and rhythm, S1 and S2 normal, no murmur, rub  or gallop   Breast Exam:  No tenderness, masses, or nipple discharge or inversion. No axillary lymphadenopathy. Large, pendulous breasts   Abdomen:  Soft, non-tender, nondistended, normoactive bowel sounds,  no masses, no hepatosplenomegaly   Genitalia:  Normal external genitalia without lesions. BUS and vagina normal; no cervical motion tenderness. No abnormal vaginal discharge. Uterus and adnexa not enlarged, nontender, no masses. Pap not performed   Rectal:  Normal tone, no masses or tenderness; guaiac negative stool   Extremities:  No clubbing, cyanosis or edema   Pulses:  2+ and symmetric all extremities   Skin:  Skin color, texture, turgor normal, no rashes or lesions   Lymph nodes:  Cervical, supraclavicular, and axillary nodes normal   Neurologic:  CNII-XII intact, normal  strength, sensation and gait; reflexes 2+ and symmetric throughout          Psych: Normal mood, affect, hygiene and grooming.     Chemistry      Component Value Date/Time   NA 137 06/07/2014 1026   K 3.5 06/07/2014 1026   CL 99 06/07/2014 1026   CO2 27 06/07/2014 1026   BUN 9 06/07/2014 1026   CREATININE 0.54 06/07/2014 1026      Component Value Date/Time   CALCIUM 9.6 06/07/2014 1026   ALKPHOS 80 06/07/2014 1026   AST 33 06/07/2014 1026   ALT 16 06/07/2014 1026   BILITOT 0.4 06/07/2014 1026     Glucose 81  Lab Results  Component Value Date   WBC 6.9 06/07/2014   HGB 11.9* 06/07/2014   HCT 34.4* 06/07/2014   MCV 95.8 06/07/2014   PLT 239 06/07/2014   Lab Results  Component Value Date   CHOL 105 01/22/2014   HDL 39* 01/22/2014   LDLCALC 56 01/22/2014   TRIG 50 01/22/2014   CHOLHDL 2.7 01/22/2014   Last uric acid was 2.6 (01/22/14)  ASSESSMENT/PLAN:  Pure hypercholesterolemia - Plan: simvastatin (ZOCOR) 40 MG tablet, Lipid panel  Essential hypertension, benign - controlled  Gouty arthropathy - well controlled on allopurinol, with very low uric  acid levels (pt prefers NOT to have dose decreased) - Plan: Uric acid  Osteopenia - calcium, vitamin D, start weight-bearing exercise, quit smoking; repeat DEXA next year - Plan: TSH, Vit D  25 hydroxy (rtn osteoporosis monitoring)  Tobacco use disorder - counseled; risks reviewed; set quit date for 08/07/14  Unspecified essential hypertension - Plan: indapamide (LOZOL) 2.5 MG tablet, Comprehensive metabolic panel  Hypokalemia - adequately replaced; continue current supplements - Plan: potassium chloride 20 MEQ/15ML (10%) solution, Comprehensive metabolic panel  Hormone replacement therapy - encourage trial of d/c med (vs spread out taper further).  reviewed risks, poss symptoms that could develop off meds  Unspecified vitamin D deficiency - Plan: Vit D  25 hydroxy (rtn osteoporosis monitoring)  Prediabetes - Plan: Hemoglobin A1c  Anemia, unspecified - Plan: CBC with Differential  Refuses flu shot today. Strongly encouraged. Will need pneumovax--too soon today.  Got prevnar in May (<6 months ago).  Mild osteopenia--plan to repeat next year  HRT--trial off medication.  Reviewed symptoms to look for.  If needs to restart, try using less frequently (q3-4 weeks, since doing fine on q2 weeks currently)  zostavax not covered--still considering. Reviewed risks/side effects.  Check on cost at pharmacy vs here (since paying OOP)  Discussed monthly self breast exams and yearly mammograms; at least 30 minutes of aerobic activity at least 5 days/week; proper sunscreen use reviewed; healthy diet, including goals of calcium and vitamin D intake and alcohol recommendations (less than or equal to 1 drink/day) reviewed; regular seatbelt use; changing batteries in smoke detectors.  Immunization recommendations discussed--see above. Flu shot recommended (declined) and zostavax.  Colonoscopy recommendations reviewed--UTD  F/u 6 months with labs prior

## 2014-07-23 ENCOUNTER — Encounter: Payer: Self-pay | Admitting: Family Medicine

## 2014-08-06 ENCOUNTER — Telehealth: Payer: Self-pay | Admitting: Family Medicine

## 2014-08-06 NOTE — Telephone Encounter (Signed)
At CPE 2 mos ago, only brief mention of occasional LBP on ROS. Please check with pt and see what kind of problems she is having and what she has tried.

## 2014-08-08 ENCOUNTER — Encounter: Payer: Self-pay | Admitting: Family Medicine

## 2014-08-08 ENCOUNTER — Ambulatory Visit (INDEPENDENT_AMBULATORY_CARE_PROVIDER_SITE_OTHER): Payer: BC Managed Care – PPO | Admitting: Family Medicine

## 2014-08-08 VITALS — BP 152/82 | HR 60 | Temp 96.5°F | Ht 61.0 in | Wt 128.0 lb

## 2014-08-08 DIAGNOSIS — M545 Low back pain: Secondary | ICD-10-CM

## 2014-08-08 LAB — POCT URINALYSIS DIPSTICK
BILIRUBIN UA: NEGATIVE
Blood, UA: NEGATIVE
Glucose, UA: NEGATIVE
KETONES UA: NEGATIVE
LEUKOCYTES UA: NEGATIVE
Nitrite, UA: NEGATIVE
PROTEIN UA: NEGATIVE
SPEC GRAV UA: 1.02
Urobilinogen, UA: NEGATIVE
pH, UA: 6

## 2014-08-08 NOTE — Progress Notes (Signed)
Chief Complaint  Patient presents with  . Back Pain    that has worsened over the past 3 weeks. Comes and goes.    She presents with complaing of low back pain. She is worried about the possibility of infection.  She denies any urinary frequency, urgency, dysuria or hematuria; no fever, chills, vomiting.  Pain is usually in the center of the low back, off and on, but recently started having pain on the right lower back.  She has taken Tylenol Arthritis, but only sporadically and can't recall if it helped.  She hasn't tried heat or any other measures.  Pain seems to resolve on its own. She recalls trying aspercreme once and noted that the skin looked red (no rash, just color change); helped in the past when used it.  Denies any numbness, tingling, weakness or radiation of the pain into her leg.  PMH, PSH, SH reviewed.  Outpatient Encounter Prescriptions as of 08/08/2014  Medication Sig Note  . allopurinol (ZYLOPRIM) 300 MG tablet Take 1 tablet (300 mg total) by mouth daily.   Marland Kitchen aspirin 81 MG tablet Take 81 mg by mouth daily.   . Calcium Carbonate-Vitamin D (CALCIUM 600+D) 600-400 MG-UNIT per tablet Take 1 tablet by mouth daily.   . cholecalciferol (VITAMIN D) 1000 UNITS tablet Take 1,000 Units by mouth daily.   . dorzolamide-timolol (COSOPT) 22.3-6.8 MG/ML ophthalmic solution Place 1 drop into both eyes 2 (two) times daily.   . indapamide (LOZOL) 2.5 MG tablet Take 1 tablet (2.5 mg total) by mouth every morning.   . potassium chloride 20 MEQ/15ML (10%) solution Take 15 mLs (20 mEq total) by mouth daily.   . simvastatin (ZOCOR) 40 MG tablet Take 1 tablet (40 mg total) by mouth every evening.   . Estradiol 10 MCG TABS vaginal tablet Place 1 tablet (10 mcg total) vaginally once a week. 01/04/2014: Cut back to every other week per Dr.Remus Hagedorn   No Known Allergies  ROS:  No fever, chills, headaches, dizziness, URI symptoms, chest pain, palpitations, abdominal pain, nausea, vomiting, urinary  complaints, bowel changes.  No bleeding, bruising, rashes.  No weakness, numbness, tingling  PHYSICAL EXAM: BP 152/82 mmHg  Pulse 60  Temp(Src) 96.5 F (35.8 C) (Tympanic)  Ht 5\' 1"  (1.549 m)  Wt 128 lb (58.06 kg)  BMI 24.20 kg/m2  Well developed, pleasant female in no distress. She currently denies pain, but admits to being anxious, worried about infection Back: no spinal tenderness, SI joint tenderness or CVA tenderness.  Area of discomfort is lower lumbar paraspinous muscle on the right, but is nontender (pain-free today) Abdomen: nontender Heart: regular rate and rhythm Lungs: clear bilaterally Extremities: no edema Neuro: alert and oriented.  DTR's 2+ and symmetric. Normal strength, sensation.  Negative SLR  Urine dip: normal   ASSESSMENT/PLAN:  Bilateral low back pain, with sciatica presence unspecified - Plan: POCT Urinalysis Dipstick  Low back pain, intermittent.  Some is central, suggestive of degenerative changes.  Some sounds muscular, intermittent. Discussed heat, stretches, massage and core strengthening exercises.  Reviewed proper lifting/bending. Return for re-evaluation if worsening pain, numbness, tingling, weakness or other concerns develop.

## 2014-08-08 NOTE — Patient Instructions (Signed)
Return for re-evaluation if worsening pain, numbness, tingling, weakness or other concerns develop. Work on core strengthening (abdominal and back muscles) as we discussed (yoga, pilates are great for this).  Hamstring stretches are also good to do. Avoid lifting/bending from the waist--bend from the knees instead.  When having pain over the muscle, you can try heat, massage, topical medications such as biofreeze. Try and maintain good posture, using lumbar support/pillow when seated for prolonged periods of time, if needed.  Back Injury Prevention Back injuries can be extremely painful and difficult to heal. After having one back injury, you are much more likely to experience another later on. It is important to learn how to avoid injuring or re-injuring your back. The following tips can help you to prevent a back injury. PHYSICAL FITNESS  Exercise regularly and try to develop good tone in your abdominal muscles. Your abdominal muscles provide a lot of the support needed by your back.  Do aerobic exercises (walking, jogging, biking, swimming) regularly.  Do exercises that increase balance and strength (tai chi, yoga) regularly. This can decrease your risk of falling and injuring your back.  Stretch before and after exercising.  Maintain a healthy weight. The more you weigh, the more stress is placed on your back. For every pound of weight, 10 times that amount of pressure is placed on the back. DIET  Talk to your caregiver about how much calcium and vitamin D you need per day. These nutrients help to prevent weakening of the bones (osteoporosis). Osteoporosis can cause broken (fractured) bones that lead to back pain.  Include good sources of calcium in your diet, such as dairy products, green, leafy vegetables, and products with calcium added (fortified).  Include good sources of vitamin D in your diet, such as milk and foods that are fortified with vitamin D.  Consider taking a  nutritional supplement or a multivitamin if needed.  Stop smoking if you smoke. POSTURE  Sit and stand up straight. Avoid leaning forward when you sit or hunching over when you stand.  Choose chairs with good low back (lumbar) support.  If you work at a desk, sit close to your work so you do not need to lean over. Keep your chin tucked in. Keep your neck drawn back and elbows bent at a right angle. Your arms should look like the letter "L."  Sit high and close to the steering wheel when you drive. Add a lumbar support to your car seat if needed.  Avoid sitting or standing in one position for too long. Take breaks to get up, stretch, and walk around at least once every hour. Take breaks if you are driving for long periods of time.  Sleep on your side with your knees slightly bent, or sleep on your back with a pillow under your knees. Do not sleep on your stomach. LIFTING, TWISTING, AND REACHING  Avoid heavy lifting, especially repetitive lifting. If you must do heavy lifting:  Stretch before lifting.  Work slowly.  Rest between lifts.  Use carts and dollies to move objects when possible.  Make several small trips instead of carrying 1 heavy load.  Ask for help when you need it.  Ask for help when moving big, awkward objects.  Follow these steps when lifting:  Stand with your feet shoulder-width apart.  Get as close to the object as you can. Do not try to pick up heavy objects that are far from your body.  Use handles or lifting straps if  they are available.  Bend at your knees. Squat down, but keep your heels off the floor.  Keep your shoulders pulled back, your chin tucked in, and your back straight.  Lift the object slowly, tightening the muscles in your legs, abdomen, and buttocks. Keep the object as close to the center of your body as possible.  When you put a load down, use these same guidelines in reverse.  Do not:  Lift the object above your waist.  Twist  at the waist while lifting or carrying a load. Move your feet if you need to turn, not your waist.  Bend over without bending at your knees.  Avoid reaching over your head, across a table, or for an object on a high surface. OTHER TIPS  Avoid wet floors and keep sidewalks clear of ice to prevent falls.  Do not sleep on a mattress that is too soft or too hard.  Keep items that are used frequently within easy reach.  Put heavier objects on shelves at waist level and lighter objects on lower or higher shelves.  Find ways to decrease your stress, such as exercise, massage, or relaxation techniques. Stress can build up in your muscles. Tense muscles are more vulnerable to injury.  Seek treatment for depression or anxiety if needed. These conditions can increase your risk of developing back pain. SEEK MEDICAL CARE IF:  You injure your back.  You have questions about diet, exercise, or other ways to prevent back injuries. MAKE SURE YOU:  Understand these instructions.  Will watch your condition.  Will get help right away if you are not doing well or get worse. Document Released: 10/15/2004 Document Revised: 11/30/2011 Document Reviewed: 10/19/2011 [facility] Patient Information 2015 [place], [place]. This information is not intended to replace advice given to you by your health care provider. Make sure you discuss any questions you have with your health care provider.  Back Pain, Adult Low back pain is very common. About 1 in 5 people have back pain.The cause of low back pain is rarely dangerous. The pain often gets better over time.About half of people with a sudden onset of back pain feel better in just 2 weeks. About 8 in 10 people feel better by 6 weeks.  CAUSES Some common causes of back pain include:  Strain of the muscles or ligaments supporting the spine.  Wear and tear (degeneration) of the spinal discs.  Arthritis.  Direct injury to the back. DIAGNOSIS Most of the  time, the direct cause of low back pain is not known.However, back pain can be treated effectively even when the exact cause of the pain is unknown.Answering your caregiver's questions about your overall health and symptoms is one of the most accurate ways to make sure the cause of your pain is not dangerous. If your caregiver needs more information, he or she may order lab work or imaging tests (X-rays or MRIs).However, even if imaging tests show changes in your back, this usually does not require surgery. HOME CARE INSTRUCTIONS For many people, back pain returns.Since low back pain is rarely dangerous, it is often a condition that people can learn to [facility] their own.   Remain active. It is stressful on the back to sit or stand in one place. Do not sit, drive, or stand in one place for more than 30 minutes at a time. Take short walks on level surfaces as soon as pain allows.Try to increase the length of time you walk each day.  Do not  stay in bed.Resting more than 1 or 2 days can delay your recovery.  Do not avoid exercise or work.Your body is made to move.It is not dangerous to be active, even though your back may hurt.Your back will likely heal faster if you return to being active before your pain is gone.  Pay attention to your body when you bend and lift. Many people have less discomfortwhen lifting if they bend their knees, keep the load close to their bodies,and avoid twisting. Often, the most comfortable positions are those that put less stress on your recovering back.  Find a comfortable position to sleep. Use a firm mattress and lie on your side with your knees slightly bent. If you lie on your back, put a pillow under your knees.  Only take over-the-counter or prescription medicines as directed by your caregiver. Over-the-counter medicines to reduce pain and inflammation are often the most helpful.Your caregiver may prescribe muscle relaxant drugs.These medicines help dull  your pain so you can more quickly return to your normal activities and healthy exercise.  Put ice on the injured area.  Put ice in a plastic bag.  Place a towel between your skin and the bag.  Leave the ice on for 15-20 minutes, 03-04 times a day for the first 2 to 3 days. After that, ice and heat may be alternated to reduce pain and spasms.  Ask your caregiver about trying back exercises and gentle massage. This may be of some benefit.  Avoid feeling anxious or stressed.Stress increases muscle tension and can worsen back pain.It is important to recognize when you are anxious or stressed and learn ways to manage it.Exercise is a great option. SEEK MEDICAL CARE IF:  You have pain that is not relieved with rest or medicine.  You have pain that does not improve in 1 week.  You have new symptoms.  You are generally not feeling well. SEEK IMMEDIATE MEDICAL CARE IF:   You have pain that radiates from your back into your legs.  You develop new bowel or bladder control problems.  You have unusual weakness or numbness in your arms or legs.  You develop nausea or vomiting.  You develop abdominal pain.  You feel faint. Document Released: 09/07/2005 Document Revised: 03/08/2012 Document Reviewed: 01/09/2014 [facility] Patient Information 2015 [place], [place]. This information is not intended to replace advice given to you by your health care provider. Make sure you discuss any questions you have with your health care provider.

## 2014-08-17 ENCOUNTER — Other Ambulatory Visit: Payer: Self-pay | Admitting: Family Medicine

## 2014-09-22 ENCOUNTER — Other Ambulatory Visit: Payer: Self-pay | Admitting: Family Medicine

## 2014-11-14 ENCOUNTER — Other Ambulatory Visit: Payer: Self-pay | Admitting: Medical

## 2014-12-13 ENCOUNTER — Other Ambulatory Visit: Payer: Self-pay | Admitting: Family Medicine

## 2014-12-13 ENCOUNTER — Other Ambulatory Visit: Payer: BLUE CROSS/BLUE SHIELD

## 2014-12-13 DIAGNOSIS — E7800 Pure hypercholesterolemia, unspecified: Secondary | ICD-10-CM

## 2014-12-13 DIAGNOSIS — R739 Hyperglycemia, unspecified: Secondary | ICD-10-CM

## 2014-12-13 DIAGNOSIS — E559 Vitamin D deficiency, unspecified: Secondary | ICD-10-CM

## 2014-12-13 DIAGNOSIS — E876 Hypokalemia: Secondary | ICD-10-CM

## 2014-12-13 DIAGNOSIS — I10 Essential (primary) hypertension: Secondary | ICD-10-CM

## 2014-12-13 DIAGNOSIS — D649 Anemia, unspecified: Secondary | ICD-10-CM

## 2014-12-13 DIAGNOSIS — M8580 Other specified disorders of bone density and structure, unspecified site: Secondary | ICD-10-CM

## 2014-12-13 DIAGNOSIS — M1A00X0 Idiopathic chronic gout, unspecified site, without tophus (tophi): Secondary | ICD-10-CM

## 2014-12-13 LAB — LIPID PANEL
CHOL/HDL RATIO: 2.6 ratio
CHOLESTEROL: 123 mg/dL (ref 0–200)
HDL: 47 mg/dL (ref >=46)
LDL Cholesterol: 60 mg/dL (ref 0–99)
TRIGLYCERIDES: 80 mg/dL (ref <150)
VLDL: 16 mg/dL (ref 0–40)

## 2014-12-13 LAB — COMPREHENSIVE METABOLIC PANEL
ALK PHOS: 69 U/L (ref 39–117)
ALT: 18 U/L (ref 0–35)
AST: 41 U/L — AB (ref 0–37)
Albumin: 4 g/dL (ref 3.5–5.2)
BILIRUBIN TOTAL: 0.3 mg/dL (ref 0.2–1.2)
BUN: 9 mg/dL (ref 6–23)
CO2: 27 mEq/L (ref 19–32)
CREATININE: 0.56 mg/dL (ref 0.50–1.10)
Calcium: 9.9 mg/dL (ref 8.4–10.5)
Chloride: 99 mEq/L (ref 96–112)
Glucose, Bld: 84 mg/dL (ref 70–99)
POTASSIUM: 3.7 meq/L (ref 3.5–5.3)
Sodium: 138 mEq/L (ref 135–145)
Total Protein: 7.2 g/dL (ref 6.0–8.3)

## 2014-12-13 LAB — HEMOGLOBIN A1C
HEMOGLOBIN A1C: 5.8 % — AB (ref <5.7)
MEAN PLASMA GLUCOSE: 120 mg/dL — AB (ref <117)

## 2014-12-13 LAB — URIC ACID: Uric Acid, Serum: 1.8 mg/dL — ABNORMAL LOW (ref 2.4–7.0)

## 2014-12-14 LAB — CBC WITH DIFFERENTIAL/PLATELET
Basophils Absolute: 0 10*3/uL (ref 0.0–0.1)
Basophils Relative: 0 % (ref 0–1)
EOS ABS: 0.1 10*3/uL (ref 0.0–0.7)
EOS PCT: 1 % (ref 0–5)
HEMATOCRIT: 35.4 % — AB (ref 36.0–46.0)
Hemoglobin: 11.6 g/dL — ABNORMAL LOW (ref 12.0–15.0)
LYMPHS ABS: 1.6 10*3/uL (ref 0.7–4.0)
LYMPHS PCT: 31 % (ref 12–46)
MCH: 33.4 pg (ref 26.0–34.0)
MCHC: 32.8 g/dL (ref 30.0–36.0)
MCV: 102 fL — AB (ref 78.0–100.0)
MONO ABS: 0.3 10*3/uL (ref 0.1–1.0)
MONOS PCT: 6 % (ref 3–12)
MPV: 9.8 fL (ref 8.6–12.4)
NEUTROS ABS: 3.1 10*3/uL (ref 1.7–7.7)
Neutrophils Relative %: 62 % (ref 43–77)
Platelets: 222 10*3/uL (ref 150–400)
RBC: 3.47 MIL/uL — AB (ref 3.87–5.11)
RDW: 13.9 % (ref 11.5–15.5)
WBC: 5 10*3/uL (ref 4.0–10.5)

## 2014-12-14 LAB — TSH: TSH: 2.516 u[IU]/mL (ref 0.350–4.500)

## 2014-12-14 LAB — VITAMIN D 25 HYDROXY (VIT D DEFICIENCY, FRACTURES): Vit D, 25-Hydroxy: 37 ng/mL (ref 30–100)

## 2014-12-17 ENCOUNTER — Ambulatory Visit (INDEPENDENT_AMBULATORY_CARE_PROVIDER_SITE_OTHER): Payer: BLUE CROSS/BLUE SHIELD | Admitting: Family Medicine

## 2014-12-17 ENCOUNTER — Encounter: Payer: Self-pay | Admitting: Family Medicine

## 2014-12-17 VITALS — BP 136/84 | HR 68 | Ht 61.0 in | Wt 131.8 lb

## 2014-12-17 DIAGNOSIS — Z720 Tobacco use: Secondary | ICD-10-CM | POA: Diagnosis not present

## 2014-12-17 DIAGNOSIS — M1009 Idiopathic gout, multiple sites: Secondary | ICD-10-CM | POA: Diagnosis not present

## 2014-12-17 DIAGNOSIS — E7800 Pure hypercholesterolemia, unspecified: Secondary | ICD-10-CM

## 2014-12-17 DIAGNOSIS — I10 Essential (primary) hypertension: Secondary | ICD-10-CM

## 2014-12-17 DIAGNOSIS — R7309 Other abnormal glucose: Secondary | ICD-10-CM | POA: Diagnosis not present

## 2014-12-17 DIAGNOSIS — D539 Nutritional anemia, unspecified: Secondary | ICD-10-CM | POA: Diagnosis not present

## 2014-12-17 DIAGNOSIS — M109 Gout, unspecified: Secondary | ICD-10-CM

## 2014-12-17 DIAGNOSIS — F17200 Nicotine dependence, unspecified, uncomplicated: Secondary | ICD-10-CM

## 2014-12-17 DIAGNOSIS — R7303 Prediabetes: Secondary | ICD-10-CM

## 2014-12-17 LAB — FOLATE: FOLATE: 17.2 ng/mL

## 2014-12-17 LAB — VITAMIN B12: Vitamin B-12: 299 pg/mL (ref 211–911)

## 2014-12-17 NOTE — Patient Instructions (Signed)
Continue your current medications.  Let us know which pharmacy you would like your refills to go to, and when you need them.  Please work on quitting smoking entirely--definitely before your next visit!

## 2014-12-17 NOTE — Progress Notes (Signed)
Chief Complaint  Patient presents with  . med check    med check. had labs done thursday. pt is going on medicare april 1st and not sure which pharmacy her meds will have to go to. she is good on her meds for another 2 weeks until she finds out which pharmacy   Hypertension follow-up: Blood pressures elsewhere are 135-140/low 80's. Denies dizziness, headaches, chest pain, edema, muscle cramps. Denies side effects of medications.   Hyperlipidemia follow-up: Patient is reportedly following a low-fat, low cholesterol diet. Compliant with medications and denies medication side effects   Gout: Denies any flares. Compliant with allopurinol (previously expressed that she is not interested in trying to change to a lower dose (uric acid level was low), and she maintains this desire).  Smoking: She can often go all day without smoking, sometimes just has 3-4/day, up to 6/day, only smoking in her garage. Her husband is going to retire, and she doesn't smoke when he is home, so likely it will be easier for her to quit.  She had planned to try and quit by her daughter's birthday, 08/07/14, but that didn't happen.  She does plan to try and quit again (no new quit date set).  HRT: She tapered off the HRT completely over 6 months ago, and has some occasional flashes, tolerable (just at night; no night sweats).  She still occasionally has some slight discomfort in her back.  Across the low back, with a soreness that radiates around to her hips. She notices slight discomfort with certain positions, not daily, maybe once a week.  PMH, PSH, SH reviewed.  Outpatient Encounter Prescriptions as of 12/17/2014  Medication Sig Note  . allopurinol (ZYLOPRIM) 300 MG tablet TAKE 1 TABLET DAILY   . aspirin 81 MG tablet Take 81 mg by mouth daily.   . Calcium Carbonate-Vitamin D (CALCIUM 600+D) 600-400 MG-UNIT per tablet Take 1 tablet by mouth daily.   . cholecalciferol (VITAMIN D) 1000 UNITS tablet Take 1,000 Units by  mouth daily.   . dorzolamide-timolol (COSOPT) 22.3-6.8 MG/ML ophthalmic solution Place 1 drop into both eyes 2 (two) times daily.   . indapamide (LOZOL) 2.5 MG tablet TAKE 1 TABLET EVERY MORNING   . potassium chloride 20 MEQ/15ML (10%) solution Take 15 mLs (20 mEq total) by mouth daily.   . simvastatin (ZOCOR) 40 MG tablet Take 1 tablet (40 mg total) by mouth every evening.   . [DISCONTINUED] Estradiol 10 MCG TABS vaginal tablet Place 1 tablet (10 mcg total) vaginally once a week. (Patient not taking: Reported on 12/17/2014) 12/17/2014: Tapered off entirely over 6 months ago   No Known Allergies  ROS:  No fevers, chills, URI symptoms, headaches, dizziness, cough, shortness of breath, chest pain, palpitations, bleeding, bruising, rash.  No nausea, vomiting, bowel changes; no edema, muscle cramps; no mood changes or other problems.  PHYSICAL EXAM: BP 140/80 mmHg  Pulse 68  Ht 5' 1"  (1.549 m)  Wt 131 lb 12.8 oz (59.784 kg)  BMI 24.92 kg/m2 136/84 on repeat by MD  Well developed, pleasant female in no distress Neck: no lymphadenopathy, thyromegaly or bruit Heart: regular rate and rhythm Lungs: Clear bilaterally, no wheezes, rales, ronchi Abdomen: soft, nontender, no organomegaly or mass Extremities: no edema, 2+ pulse Psych: normal mood, affect, hygiene and grooming Neuro: alert and oriented. Cranial nerves intact. Normal gait. As with previous visits, she had a little trouble getting up on the exam table--she needed assistance, but was not as anxious as in prior visits  about falling.     Chemistry      Component Value Date/Time   NA 138 12/13/2014 0001   K 3.7 12/13/2014 0001   CL 99 12/13/2014 0001   CO2 27 12/13/2014 0001   BUN 9 12/13/2014 0001   CREATININE 0.56 12/13/2014 0001      Component Value Date/Time   CALCIUM 9.9 12/13/2014 0001   ALKPHOS 69 12/13/2014 0001   AST 41* 12/13/2014 0001   ALT 18 12/13/2014 0001   BILITOT 0.3 12/13/2014 0001     Fasting glucose  84  Lab Results  Component Value Date   CHOL 123 12/13/2014   HDL 47 12/13/2014   LDLCALC 60 12/13/2014   TRIG 80 12/13/2014   CHOLHDL 2.6 12/13/2014   Lab Results  Component Value Date   WBC 5.0 12/13/2014   HGB 11.6* 12/13/2014   HCT 35.4* 12/13/2014   MCV 102.0* 12/13/2014   PLT 222 12/13/2014   Uric acid 1.8  Lab Results  Component Value Date   TSH 2.516 12/13/2014   Lab Results  Component Value Date   HGBA1C 5.8* 12/13/2014   Vitamin D-OH 37  ASSESSMENT/PLAN:  Pure hypercholesterolemia - at goal on current regimen. 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 declines decrease in allopurinol dose.  Tobacco use disorder - counseled; encouraged to set quit date and to stop buying cigarettes.  Macrocytic anemia - add B12 and folate to recent labs   6 months--CPE [facility] Physical Fasting labs prior-- c-met, CBC, A1c  (lipids and uric acid are well controlled, can be checked just yearly)

## 2014-12-24 ENCOUNTER — Other Ambulatory Visit: Payer: Self-pay | Admitting: *Deleted

## 2014-12-24 MED ORDER — INDAPAMIDE 2.5 MG PO TABS: 2.5000 mg | ORAL_TABLET | Freq: Every morning | ORAL | Status: DC

## 2014-12-26 ENCOUNTER — Other Ambulatory Visit: Payer: Self-pay | Admitting: *Deleted

## 2014-12-26 ENCOUNTER — Telehealth: Payer: Self-pay | Admitting: Internal Medicine

## 2014-12-26 DIAGNOSIS — E7800 Pure hypercholesterolemia, unspecified: Secondary | ICD-10-CM

## 2014-12-26 DIAGNOSIS — E876 Hypokalemia: Secondary | ICD-10-CM

## 2014-12-26 MED ORDER — SIMVASTATIN 40 MG PO TABS: 40.0000 mg | ORAL_TABLET | Freq: Every evening | ORAL | Status: DC

## 2014-12-26 MED ORDER — POTASSIUM CHLORIDE CRYS ER 20 MEQ PO TBCR: 20.0000 meq | EXTENDED_RELEASE_TABLET | Freq: Once | ORAL | Status: DC

## 2014-12-26 MED ORDER — ASPIRIN 81 MG PO TABS: 81.0000 mg | ORAL_TABLET | Freq: Every day | ORAL | Status: DC

## 2014-12-26 MED ORDER — ALLOPURINOL 300 MG PO TABS: 300.0000 mg | ORAL_TABLET | Freq: Every day | ORAL | Status: DC

## 2014-12-26 MED ORDER — INDAPAMIDE 2.5 MG PO TABS: 2.5000 mg | ORAL_TABLET | Freq: Every morning | ORAL | Status: DC

## 2014-12-26 NOTE — Telephone Encounter (Signed)
Also a request for indapamide, potassium chl, simvastatin

## 2014-12-26 NOTE — Telephone Encounter (Signed)
Patient needs new rx's sent to [facility](new mail order). I have sent them all except the cosopt (don't think you have rx'd-maybe eye doctor?) and the aspirin, didn't know that we could rx this. Please advise, thanks.

## 2014-12-26 NOTE — Telephone Encounter (Signed)
Refill request for allopurinol, aspirin, dorzol/timol sol op, to [facility]

## 2014-12-26 NOTE — Telephone Encounter (Signed)
Do not rx her eye drops--needs to come from her ophtho.  Not sure if you can do aspirin--okay with me.

## 2014-12-28 ENCOUNTER — Telehealth: Payer: Self-pay | Admitting: Family Medicine

## 2014-12-28 NOTE — Telephone Encounter (Signed)
These are eye drops.  I don't prescribe these for her.  This request needs to go to her eye doctor, not me

## 2014-12-28 NOTE — Telephone Encounter (Signed)
Recvd refill request for Dorzolamide Timolol 22.3-6.8mg 

## 2015-03-21 ENCOUNTER — Other Ambulatory Visit: Payer: Self-pay | Admitting: Family Medicine

## 2015-03-21 NOTE — Telephone Encounter (Signed)
Dr.Knapp is this okay to refill it was done in April for 90 days with no refill

## 2015-04-09 DIAGNOSIS — H4011X1 Primary open-angle glaucoma, mild stage: Secondary | ICD-10-CM | POA: Diagnosis not present

## 2015-04-12 ENCOUNTER — Other Ambulatory Visit: Payer: Self-pay

## 2015-04-12 DIAGNOSIS — Z1231 Encounter for screening mammogram for malignant neoplasm of breast: Secondary | ICD-10-CM

## 2015-04-24 ENCOUNTER — Other Ambulatory Visit: Payer: Self-pay | Admitting: Family Medicine

## 2015-05-21 ENCOUNTER — Other Ambulatory Visit: Payer: Self-pay | Admitting: Family Medicine

## 2015-05-23 ENCOUNTER — Other Ambulatory Visit: Payer: Self-pay | Admitting: Family Medicine

## 2015-05-23 ENCOUNTER — Ambulatory Visit
Admission: RE | Admit: 2015-05-23 | Discharge: 2015-05-23 | Disposition: A | Discharge status: Home / Self Care | Payer: Medicare Other | Source: Ambulatory Visit

## 2015-05-23 DIAGNOSIS — Z1231 Encounter for screening mammogram for malignant neoplasm of breast: Secondary | ICD-10-CM | POA: Diagnosis not present

## 2015-06-17 ENCOUNTER — Other Ambulatory Visit: Payer: Medicare Other

## 2015-06-17 ENCOUNTER — Other Ambulatory Visit: Payer: Self-pay | Admitting: Family Medicine

## 2015-06-17 DIAGNOSIS — R7303 Prediabetes: Secondary | ICD-10-CM

## 2015-06-17 DIAGNOSIS — D539 Nutritional anemia, unspecified: Secondary | ICD-10-CM | POA: Diagnosis not present

## 2015-06-17 DIAGNOSIS — I10 Essential (primary) hypertension: Secondary | ICD-10-CM | POA: Diagnosis not present

## 2015-06-17 DIAGNOSIS — E7800 Pure hypercholesterolemia, unspecified: Secondary | ICD-10-CM

## 2015-06-17 DIAGNOSIS — R7309 Other abnormal glucose: Secondary | ICD-10-CM | POA: Diagnosis not present

## 2015-06-17 LAB — COMPREHENSIVE METABOLIC PANEL
ALT: 18 U/L (ref 6–29)
AST: 43 U/L — AB (ref 10–35)
Albumin: 4.1 g/dL (ref 3.6–5.1)
Alkaline Phosphatase: 75 U/L (ref 33–130)
BILIRUBIN TOTAL: 0.4 mg/dL (ref 0.2–1.2)
BUN: 10 mg/dL (ref 7–25)
CALCIUM: 9.7 mg/dL (ref 8.6–10.4)
CO2: 29 mmol/L (ref 20–31)
Chloride: 99 mmol/L (ref 98–110)
Creat: 0.59 mg/dL (ref 0.50–0.99)
GLUCOSE: 81 mg/dL (ref 65–99)
Potassium: 3.3 mmol/L — ABNORMAL LOW (ref 3.5–5.3)
Sodium: 134 mmol/L — ABNORMAL LOW (ref 135–146)
Total Protein: 7.5 g/dL (ref 6.1–8.1)

## 2015-06-17 LAB — CBC WITH DIFFERENTIAL/PLATELET
Basophils Absolute: 0.1 10*3/uL (ref 0.0–0.1)
Basophils Relative: 1 % (ref 0–1)
EOS PCT: 1 % (ref 0–5)
Eosinophils Absolute: 0.1 10*3/uL (ref 0.0–0.7)
HEMATOCRIT: 34.9 % — AB (ref 36.0–46.0)
Hemoglobin: 12 g/dL (ref 12.0–15.0)
Lymphocytes Relative: 22 % (ref 12–46)
Lymphs Abs: 1.4 10*3/uL (ref 0.7–4.0)
MCH: 34.2 pg — ABNORMAL HIGH (ref 26.0–34.0)
MCHC: 34.4 g/dL (ref 30.0–36.0)
MCV: 99.4 fL (ref 78.0–100.0)
MPV: 9.3 fL (ref 8.6–12.4)
Monocytes Absolute: 0.4 10*3/uL (ref 0.1–1.0)
Monocytes Relative: 7 % (ref 3–12)
Neutro Abs: 4.4 10*3/uL (ref 1.7–7.7)
Neutrophils Relative %: 69 % (ref 43–77)
Platelets: 210 10*3/uL (ref 150–400)
RBC: 3.51 MIL/uL — AB (ref 3.87–5.11)
RDW: 14.1 % (ref 11.5–15.5)
WBC: 6.4 10*3/uL (ref 4.0–10.5)

## 2015-06-18 LAB — HEMOGLOBIN A1C
Hgb A1c MFr Bld: 5.8 % — ABNORMAL HIGH (ref <5.7)
Mean Plasma Glucose: 120 mg/dL — ABNORMAL HIGH (ref <117)

## 2015-06-19 ENCOUNTER — Other Ambulatory Visit (HOSPITAL_COMMUNITY)
Admission: RE | Admit: 2015-06-19 | Discharge: 2015-06-19 | Disposition: A | Discharge status: Home / Self Care | Payer: Medicare Other | Source: Ambulatory Visit | Attending: Family Medicine | Admitting: Family Medicine

## 2015-06-19 ENCOUNTER — Ambulatory Visit (INDEPENDENT_AMBULATORY_CARE_PROVIDER_SITE_OTHER): Payer: Medicare Other | Admitting: Family Medicine

## 2015-06-19 ENCOUNTER — Encounter: Payer: Self-pay | Admitting: Family Medicine

## 2015-06-19 VITALS — BP 138/80 | HR 60 | Temp 98.1°F | Ht 61.0 in | Wt 132.8 lb

## 2015-06-19 DIAGNOSIS — Z1211 Encounter for screening for malignant neoplasm of colon: Secondary | ICD-10-CM | POA: Diagnosis not present

## 2015-06-19 DIAGNOSIS — E559 Vitamin D deficiency, unspecified: Secondary | ICD-10-CM

## 2015-06-19 DIAGNOSIS — M1009 Idiopathic gout, multiple sites: Secondary | ICD-10-CM | POA: Diagnosis not present

## 2015-06-19 DIAGNOSIS — I10 Essential (primary) hypertension: Secondary | ICD-10-CM | POA: Diagnosis not present

## 2015-06-19 DIAGNOSIS — Z23 Encounter for immunization: Secondary | ICD-10-CM

## 2015-06-19 DIAGNOSIS — Z720 Tobacco use: Secondary | ICD-10-CM

## 2015-06-19 DIAGNOSIS — Z0000 Encounter for general adult medical examination without abnormal findings: Secondary | ICD-10-CM | POA: Diagnosis not present

## 2015-06-19 DIAGNOSIS — Z01419 Encounter for gynecological examination (general) (routine) without abnormal findings: Secondary | ICD-10-CM | POA: Diagnosis not present

## 2015-06-19 DIAGNOSIS — R5383 Other fatigue: Secondary | ICD-10-CM | POA: Diagnosis not present

## 2015-06-19 DIAGNOSIS — E7800 Pure hypercholesterolemia, unspecified: Secondary | ICD-10-CM

## 2015-06-19 DIAGNOSIS — F17200 Nicotine dependence, unspecified, uncomplicated: Secondary | ICD-10-CM

## 2015-06-19 DIAGNOSIS — Z1151 Encounter for screening for human papillomavirus (HPV): Secondary | ICD-10-CM | POA: Diagnosis not present

## 2015-06-19 DIAGNOSIS — R7309 Other abnormal glucose: Secondary | ICD-10-CM

## 2015-06-19 DIAGNOSIS — R7303 Prediabetes: Secondary | ICD-10-CM | POA: Insufficient documentation

## 2015-06-19 DIAGNOSIS — E876 Hypokalemia: Secondary | ICD-10-CM | POA: Diagnosis not present

## 2015-06-19 DIAGNOSIS — M109 Gout, unspecified: Secondary | ICD-10-CM

## 2015-06-19 LAB — HEMOCCULT GUIAC POC 1CARD (OFFICE): FECAL OCCULT BLD: NEGATIVE

## 2015-06-19 NOTE — Patient Instructions (Addendum)
HEALTH MAINTENANCE RECOMMENDATIONS:  It is recommended that you get at least 30 minutes of aerobic exercise at least 5 days/week (for weight loss, you may need as much as 60-90 minutes). This can be any activity that gets your heart rate up. This can be divided in 10-15 minute intervals if needed, but try and build up your endurance at least once a week.  Weight bearing exercise is also recommended twice weekly.  Eat a healthy diet with lots of vegetables, fruits and fiber.  "Colorful" foods have a lot of vitamins (ie green vegetables, tomatoes, red peppers, etc).  Limit sweet tea, regular sodas and alcoholic beverages, all of which has a lot of calories and sugar.  Up to 1 alcoholic drink daily may be beneficial for women (unless trying to lose weight, watch sugars).  Drink a lot of water.  Calcium recommendations are 1200-1500 mg daily (1500 mg for postmenopausal women or women without ovaries), and vitamin D 1000 IU daily.  This should be obtained from diet and/or supplements (vitamins), and calcium should not be taken all at once, but in divided doses.  Monthly self breast exams and yearly mammograms for women over the age of 42 is recommended.  Sunscreen of at least SPF 30 should be used on all sun-exposed parts of the skin when outside between the hours of 10 am and 4 pm (not just when at beach or pool, but even with exercise, golf, tennis, and yard work!)  Use a sunscreen that says "broad spectrum" so it covers both UVA and UVB rays, and make sure to reapply every 1-2 hours.  Remember to change the batteries in your smoke detectors when changing your clock times in the spring and fall.  Use your seat belt every time you are in a car, and please drive safely and not be distracted with cell phones and texting while driving.    Bailey Parker , Thank you for taking time to come for your Medicare Wellness Visit. I appreciate your ongoing commitment to your health goals. Please review the  following plan we discussed and let me know if I can assist you in the future.   These are the goals we discussed: Goals    None      This is a list of the screening recommended for you and due dates:  Health Maintenance  Topic Date Due  .  Hepatitis C: One time screening is recommended by Center for Disease Control  (CDC) for  adults born from [birth year] through 1967.   04-28-1951  . Shingles Vaccine  05/02/2009  . Pneumonia vaccines (2 of 2 - PPSV23) 01/23/2015  . Flu Shot  04/22/2015  . Tetanus Vaccine  02/03/2016  . Mammogram  05/22/2017  . Colon Cancer Screening  04/15/2021  . DEXA scan (bone density measurement)  Completed   Your colonoscopy is actually due 2017, (not 2022 as stated above), as you were told to have another in FIVE years, not TEN. We added on the hepatitis C screening test to the blood drawn earlier this week.  I strongly recommend that you get flu shots yearly.  We gave you the second of two pneumonia vaccines today (pneumovax; you had prevnar-13 lat year).  Look into getting the shingles vaccine at the pharmacy (you were given a written prescription to take with you).  They can check and tell you what your out of pocket costs will be. Please let us know if/when you get the vaccine so we can note the  date in your chart.  Get [place] copies of your Living Will and Healthcare power of attorney.  Please try and quit smoking--start thinking about why/when you smoke (habit, boredom, stress) in order to come up with effective strategies to cut back or quit. Available resources to help you quit include free counseling through [facility] ([place]uit[facility] or 1-800-QUITNOW), smoking cessation classes through [facility] (call to find out schedule), over-the-counter nicotine replacements, and e-cigarettes (although this may not help break the hand-mouth habit).  Many insurance companies also have smoking cessation programs (which may decrease the cost of patches,  meds if enrolled).  If these methods are not effective for you, and you are motivated to quit, return to discuss the possibility of prescription medications.  Try and work on strengthening your core (abdominal muscles and back--ie pilates, yoga), along with regular exercise and weight loss. Use tylenol as needed for any back pain. You can also try heat, stretches and massage, as well as a topical medication such as Biofreeze or [employer].  Your potassium was a little low, despite you taking 15 mL of potassium daily.  You either need to increase that to 30 mL 3x/weekor ensure that you get more potassium in your diet every day (ie bananas, potatoes, etc).  If you start having a lot of muscle cramps, call us to have the potassium rechecked, so that we can further adjust your potassium dose.

## 2015-06-19 NOTE — Progress Notes (Signed)
Chief Complaint  Patient presents with  . Med check plus    nonfasting med check plus with pap. (labs already done). Still concerned with her back pain that she spoke to you about last year-could it be arthritis?   . Flu Vaccine    declined flu vaccine.   . Nasal Congestion    has been having some congestion since last Wednesday-having slight cough as well. No fevers, no chill or body aches.    Bailey Parker is a 64 y.o. female who presents for annual wellness visit and follow-up on chronic medical conditions.  She has the following concerns:  She complains of 1 week of nasal congestion, which developed while in a store, like an allergy. Sniffling, no sneezes.  Some coughing. No runny nose.  Mucus from nose has been clear.  Coughed up a small amount of white phlegm yesterday only.  No fever or chills. No sick contacts.  Hasn't taken any medications for this, just a cough drop.  She still occasionally has some slight discomfort in her back. Across the low back, with a soreness that radiates around to her hips. She notices slight discomfort with certain positions, not daily, maybe once a week. She mentioned this 6 months ago, but it persists, recurring intermittently. It is in the center of the low back, and sometimes will radiate to either side, never down the leg.  Just yesterday she had slight tingling in a leg (resolved).  Hypertension follow-up: Blood pressures elsewhere are 135-140/low 80's. Denies dizziness, headaches, chest pain, edema, muscle cramps. Denies side effects of medications.   Hyperlipidemia follow-up: Patient is reportedly following a low-fat, low cholesterol diet. Compliant with medications and denies medication side effects. Lab Results  Component Value Date   CHOL 123 12/13/2014   HDL 47 12/13/2014   LDLCALC 60 12/13/2014   TRIG 80 12/13/2014   CHOLHDL 2.6 12/13/2014    Gout: Denies any flares. Compliant with allopurinol (previously expressed that she is not  interested in trying to change to a lower dose (uric acid level was previously low), and she maintains this desire).  Smoking: She can often go all day without smoking, sometimes just has 3-4/day, up to 6/day, only smoking in her garage. Her husband retired, and she doesn't smoke when he is home.  HRT: She tapered off the HRT completely, and is doing well. Denies hot flashes, night sweats.  Immunization History  Administered Date(s) Administered  . Pneumococcal Conjugate-13 01/22/2014  . Td 08/03/1995  . Tdap 02/02/2006  refuses flu shots Last Pap smear: 05/12/12 Last mammogram: 05/2015 Last colonoscopy: 04/16/2011, due again 2017 Last DEXA: 2011 per Dr. Nestor Ramp are in old chart, and show T-1.1 at femur (unchanged from 2004). She thinks she had another one in 2013 Dentist: 3x/year Ophtho: twice yearly  Exercise: no regular exercise currently.  Other doctors caring for patient include: Ophtho: [place] at [facility] ophtho Dentist: Dr. Radford Pax GI: Dr. Earlean Shawl Podiatrist ([place])  Depression, fall and ADL screens are negative (just commented that she feels more comfortable holding onto a handrail when going up stairs).  See epic questionnaires.  End of Life Discussion:  Patient has a living will and medical power of attorney  Past Medical History  Diagnosis Date  . COPD (chronic obstructive pulmonary disease)   . Gouty arthropathy     R foot  . LBP (low back pain)   . Dietary surveillance and counseling   . Prediabetes   . Vitamin D deficiency   .  Hypertension     controlled  . Hyperlipidemia 1997  . Glaucoma     Dr. Ellie Lunch  . Hx of adenomatous colonic polyps 2012    Dr. Earlean Shawl  . Internal hemorrhoids 2012  . Diverticulosis 2012  . Postmenopausal   . Osteopenia 04/2010    (DEXA ordered by Dr. Ubaldo Glassing, scheduled for 04/2012)  . Smoker     Past Surgical History  Procedure Laterality Date  . Appendectomy  1966  . Tubal ligation  1980  . Colonoscopy  03/2011     Dr. Earlean Shawl  . Band hemorrhoidectomy  06/2011    Dr. Earlean Shawl    Social History   Social History  . Marital Status: Married    Spouse Name: N/A  . Number of Children: 2  . Years of Education: N/A   Occupational History  . retired Transport planner)    Social History Main Topics  . Smoking status: Current Every Day Smoker -- 0.30 packs/day for 40 years    Types: Cigarettes  . Smokeless tobacco: Never Used     Comment: cut back to 0-6, usually 3-4 cigarettes/day (05/2015)  . Alcohol Use: 0.0 oz/week    0 Standard drinks or equivalent per week     Comment: 2 drinks per week.  . Drug Use: No  . Sexual Activity:    Partners: Male   Other Topics Concern  . Not on file   Social History Narrative   Lives with husband.  1 daughter in [place], the other daughter lives down the street. 2 stepgrandchildren    Family History  Problem Relation Age of Onset  . Alzheimer's disease Mother   . Hypertension Mother   . Stroke Mother 67  . Heart disease Father   . Diabetes Father   . Hypertension Brother   . Diabetes Brother   . Cancer Neg Hx     Outpatient Encounter Prescriptions as of 06/19/2015  Medication Sig  . allopurinol (ZYLOPRIM) 300 MG tablet Take 1 tablet by mouth  daily  . aspirin 81 MG tablet Take 1 tablet (81 mg total) by mouth daily.  . Calcium Carbonate-Vitamin D (CALCIUM 600+D) 600-400 MG-UNIT per tablet Take 1 tablet by mouth daily.  . cholecalciferol (VITAMIN D) 1000 UNITS tablet Take 1,000 Units by mouth daily.  . dorzolamide-timolol (COSOPT) 22.3-6.8 MG/ML ophthalmic solution Place 1 drop into both eyes 2 (two) times daily.  . indapamide (LOZOL) 2.5 MG tablet Take 1 tablet by mouth  every morning  . potassium chloride 20 MEQ/15ML (10%) SOLN Take 20 mEq by mouth daily.  . simvastatin (ZOCOR) 40 MG tablet Take 1 tablet by mouth  every evening   No facility-administered encounter medications on file as of 06/19/2015.    No Known Allergies  ROS: The patient denies  anorexia, fever, weight changes, headaches, vision changes, decreased hearing, ear pain, sore throat, breast concerns, chest pain, palpitations, dizziness, syncope, dyspnea on exertion, cough, swelling, nausea, vomiting, diarrhea, constipation, abdominal pain, melena, hematochezia, indigestion/heartburn, hematuria, incontinence, dysuria, vaginal bleeding, discharge, odor or itch, genital lesions, joint pains, numbness, tingling, weakness, tremor, suspicious skin lesions, depression, anxiety, abnormal bleeding/bruising, or enlarged lymph nodes. Low back pain as per HPI. Some nasal congestion and slight cough as per HPI.   PHYSICAL EXAM:  BP 138/80 mmHg  Pulse 60  Temp(Src) 98.1 F (36.7 C) (Tympanic)  Ht 5' 1" (1.549 m)  Wt 132 lb 12.8 oz (60.238 kg)  BMI 25.11 kg/m2  General Appearance:  Alert, cooperative, no distress, appears stated  age   Head:  Normocephalic, without obvious abnormality, atraumatic   Eyes:  PERRL, conjunctiva/corneas clear, EOM's intact, fundi  benign   Ears:  Normal TM's and external ear canals   Nose:  Nares normal, mucosa is pale, mildly-mod edematous, no drainage or sinus tenderness   Throat:  Lips, mucosa, and tongue normal; teeth and gums normal. Torus pallatini superiorly  Neck:  Supple, no lymphadenopathy; thyroid: no enlargement/tenderness/nodules; no carotid  bruit or JVD   Back:  Spine nontender, no curvature, ROM normal, no CVA tenderness. Area of discomfort is lower lumbar spine and paraspinous muscles--nontender and no palpable spasm today  Lungs:  Clear to auscultation bilaterally without wheezes, rales or ronchi; respirations unlabored   Chest Wall:  No tenderness or deformity   Heart:  Regular rate and rhythm, S1 and S2 normal, no murmur, rub  or gallop   Breast Exam:  No tenderness, masses, or nipple discharge or inversion. No axillary lymphadenopathy. Large, pendulous breasts   Abdomen:  Soft, non-tender, nondistended,  normoactive bowel sounds,  no masses, no hepatosplenomegaly   Genitalia:  Normal external genitalia without lesions. BUS and vagina normal; cervix is normal, no lesions or discharge; no cervical motion tenderness. No abnormal vaginal discharge. Uterus and adnexa not enlarged, nontender, no masses. Pap performed   Rectal:  Normal tone, no masses or tenderness; guaiac negative stool   Extremities:  No clubbing, cyanosis or edema   Pulses:  2+ and symmetric all extremities   Skin:  Skin color, texture, turgor normal, no rashes or lesions   Lymph nodes:  Cervical, supraclavicular, and axillary nodes normal   Neurologic:  CNII-XII intact, normal strength, sensation and gait; reflexes 2+ and symmetric throughout   Psych: Normal mood, affect, hygiene and grooming.         Lab Results  Component Value Date   HGBA1C 5.8* 06/17/2015     Chemistry      Component Value Date/Time   NA 134* 06/17/2015 0001   K 3.3* 06/17/2015 0001   CL 99 06/17/2015 0001   CO2 29 06/17/2015 0001   BUN 10 06/17/2015 0001   CREATININE 0.59 06/17/2015 0001      Component Value Date/Time   CALCIUM 9.7 06/17/2015 0001   ALKPHOS 75 06/17/2015 0001   AST 43* 06/17/2015 0001   ALT 18 06/17/2015 0001   BILITOT 0.4 06/17/2015 0001     Fasting glucose 81 Lab Results  Component Value Date   WBC 6.4 06/17/2015   HGB 12.0 06/17/2015   HCT 34.9* 06/17/2015   MCV 99.4 06/17/2015   PLT 210 06/17/2015   EKG:  NSR, occ PAC noted. No acute abnormalities.    ASSESSMENT/PLAN:  Medicare annual wellness visit, initial  Immunization due - Plan: Pneumococcal polysaccharide vaccine 23-valent greater than or equal to 2yo subcutaneous/IM  Encounter for routine gynecological examination - Plan: Cytology - PAP [place]  Pure hypercholesterolemia  Essential hypertension, benign - controlled; recommended daily exercise, low sodium diet and weight loss at waist. - Plan: EKG  12-Lead  Gouty arthropathy - on allopurinol with no flares in years.  not interested in lowering dose  Tobacco use disorder - counseled extensively to help with cessation.  Hypokalemia - reports compliance with potassium supplement. reviewed dietary sources. increase to 2m 3x/wk, continue 148mon other days, plus more i diet. f/u if spasms  Prediabetes - A1c stable at 5.8. Daily exercise, lowfat diet, weight loss recommended. Recheck 6 mos  Discussed monthly self breast exams and yearly  mammograms; at least 30 minutes of aerobic activity at least 5 days/week and weight-bearing exercise 2x/week; proper sunscreen use reviewed; healthy diet, including goals of calcium and vitamin D intake and alcohol recommendations (less than or equal to 1 drink/day) reviewed; regular seatbelt use; changing batteries in smoke detectors.  Immunization recommendations discussed, see below.  Colonoscopy recommendations reviewed, UTD, due again 2017.  Look into getting the shingles vaccine at the pharmacy (you were given a written prescription to take with you).  They can check and tell you what your out of pocket costs will be. Please let us know if/when you get the vaccine so we can note the date in your chart. Refuses flu shots--counseled extensively, strongly encouraged. Pneumovax given today.  Risks/side effects reviewed.  Smoking cessation counseling. Encouraged to set quit date and not to buy more cigarettes. Save $ saved from not buying cigarettes to buy something for herself.  Get [place] copies of your Living Will and Healthcare power of attorney.  6 months with labs prior--A1c, uric acid, TSH, c-met, lipid,  Pt to get [place] copy of her healthcare POA and living will. Full Code, Full care  Medicare Attestation I have personally reviewed: The patient's medical and social history Their use of alcohol, tobacco or illicit drugs Their current medications and supplements The patient's functional ability  including ADLs,fall risks, home safety risks, cognitive, and hearing and visual impairment Diet and physical activities Evidence for depression or mood disorders  The patient's weight, height, and BM have been recorded in the chart.  I have made referrals, counseling, and provided education to the patient based on review of the above and I have provided the patient with a written personalized care plan for preventive services.     KNAPP,EVE A, MD   06/19/2015

## 2015-06-20 LAB — HEPATITIS C ANTIBODY: HCV AB: NEGATIVE

## 2015-06-21 ENCOUNTER — Encounter: Payer: Self-pay | Admitting: Family Medicine

## 2015-06-21 LAB — CYTOLOGY - PAP

## 2015-06-25 ENCOUNTER — Other Ambulatory Visit: Payer: Self-pay | Admitting: Family Medicine

## 2015-07-24 ENCOUNTER — Other Ambulatory Visit: Payer: Self-pay | Admitting: Family Medicine

## 2015-08-20 ENCOUNTER — Other Ambulatory Visit: Payer: Self-pay | Admitting: Family Medicine

## 2015-08-20 NOTE — Telephone Encounter (Signed)
Is this okay to refill? 

## 2015-08-20 NOTE — Telephone Encounter (Signed)
Yes. Recently seen and labs not due again until March. Dose should be 30ML three times/week, and 15ML 4x/wk, as it was last adjusted on 9/28 visit.

## 2015-08-20 NOTE — Telephone Encounter (Signed)
Sent in med to pharmacy 

## 2015-09-26 ENCOUNTER — Other Ambulatory Visit: Payer: Self-pay | Admitting: Family Medicine

## 2015-10-15 DIAGNOSIS — H401122 Primary open-angle glaucoma, left eye, moderate stage: Secondary | ICD-10-CM | POA: Diagnosis not present

## 2015-10-15 DIAGNOSIS — H5213 Myopia, bilateral: Secondary | ICD-10-CM | POA: Diagnosis not present

## 2015-10-15 DIAGNOSIS — H401111 Primary open-angle glaucoma, right eye, mild stage: Secondary | ICD-10-CM | POA: Diagnosis not present

## 2015-12-16 ENCOUNTER — Other Ambulatory Visit: Payer: Medicare Other

## 2015-12-16 DIAGNOSIS — R7309 Other abnormal glucose: Secondary | ICD-10-CM | POA: Diagnosis not present

## 2015-12-16 DIAGNOSIS — M1000 Idiopathic gout, unspecified site: Secondary | ICD-10-CM | POA: Diagnosis not present

## 2015-12-16 DIAGNOSIS — R5383 Other fatigue: Secondary | ICD-10-CM | POA: Diagnosis not present

## 2015-12-16 DIAGNOSIS — I10 Essential (primary) hypertension: Secondary | ICD-10-CM

## 2015-12-16 DIAGNOSIS — E876 Hypokalemia: Secondary | ICD-10-CM

## 2015-12-16 DIAGNOSIS — R7303 Prediabetes: Secondary | ICD-10-CM | POA: Diagnosis not present

## 2015-12-16 DIAGNOSIS — M1009 Idiopathic gout, multiple sites: Secondary | ICD-10-CM | POA: Diagnosis not present

## 2015-12-16 DIAGNOSIS — M109 Gout, unspecified: Secondary | ICD-10-CM

## 2015-12-16 DIAGNOSIS — E7800 Pure hypercholesterolemia, unspecified: Secondary | ICD-10-CM

## 2015-12-16 DIAGNOSIS — E559 Vitamin D deficiency, unspecified: Secondary | ICD-10-CM

## 2015-12-16 LAB — LIPID PANEL
Cholesterol: 110 mg/dL — ABNORMAL LOW (ref 125–200)
HDL: 50 mg/dL (ref >=46)
LDL CALC: 46 mg/dL (ref <130)
Total CHOL/HDL Ratio: 2.2 Ratio (ref <=5.0)
Triglycerides: 69 mg/dL (ref <150)
VLDL: 14 mg/dL (ref <30)

## 2015-12-16 LAB — COMPREHENSIVE METABOLIC PANEL
ALT: 14 U/L (ref 6–29)
AST: 42 U/L — ABNORMAL HIGH (ref 10–35)
Albumin: 4 g/dL (ref 3.6–5.1)
Alkaline Phosphatase: 72 U/L (ref 33–130)
BUN: 8 mg/dL (ref 7–25)
CO2: 27 mmol/L (ref 20–31)
Calcium: 9.2 mg/dL (ref 8.6–10.4)
Chloride: 100 mmol/L (ref 98–110)
Creat: 0.57 mg/dL (ref 0.50–0.99)
Glucose, Bld: 82 mg/dL (ref 65–99)
Potassium: 3.8 mmol/L (ref 3.5–5.3)
Sodium: 139 mmol/L (ref 135–146)
Total Bilirubin: 0.5 mg/dL (ref 0.2–1.2)
Total Protein: 7.1 g/dL (ref 6.1–8.1)

## 2015-12-16 LAB — HEMOGLOBIN A1C
Hgb A1c MFr Bld: 5.6 % (ref <5.7)
MEAN PLASMA GLUCOSE: 114 mg/dL

## 2015-12-16 LAB — TSH: TSH: 2.11 mIU/L

## 2015-12-16 LAB — URIC ACID: URIC ACID, SERUM: 2 mg/dL — AB (ref 2.4–7.0)

## 2015-12-17 LAB — VITAMIN D 25 HYDROXY (VIT D DEFICIENCY, FRACTURES): VIT D 25 HYDROXY: 55 ng/mL (ref 30–100)

## 2015-12-18 ENCOUNTER — Encounter: Payer: Self-pay | Admitting: Family Medicine

## 2015-12-18 ENCOUNTER — Ambulatory Visit (INDEPENDENT_AMBULATORY_CARE_PROVIDER_SITE_OTHER): Payer: Medicare Other | Admitting: Family Medicine

## 2015-12-18 VITALS — BP 140/84 | HR 64 | Ht 61.0 in | Wt 130.4 lb

## 2015-12-18 DIAGNOSIS — R7303 Prediabetes: Secondary | ICD-10-CM

## 2015-12-18 DIAGNOSIS — I10 Essential (primary) hypertension: Secondary | ICD-10-CM

## 2015-12-18 DIAGNOSIS — M1009 Idiopathic gout, multiple sites: Secondary | ICD-10-CM

## 2015-12-18 DIAGNOSIS — R7301 Impaired fasting glucose: Secondary | ICD-10-CM

## 2015-12-18 DIAGNOSIS — M79674 Pain in right toe(s): Secondary | ICD-10-CM

## 2015-12-18 DIAGNOSIS — M109 Gout, unspecified: Secondary | ICD-10-CM

## 2015-12-18 DIAGNOSIS — Z5181 Encounter for therapeutic drug level monitoring: Secondary | ICD-10-CM

## 2015-12-18 DIAGNOSIS — F17200 Nicotine dependence, unspecified, uncomplicated: Secondary | ICD-10-CM | POA: Diagnosis not present

## 2015-12-18 DIAGNOSIS — E7800 Pure hypercholesterolemia, unspecified: Secondary | ICD-10-CM | POA: Diagnosis not present

## 2015-12-18 DIAGNOSIS — E876 Hypokalemia: Secondary | ICD-10-CM

## 2015-12-18 MED ORDER — ALLOPURINOL 100 MG PO TABS: 100.0000 mg | ORAL_TABLET | Freq: Every day | ORAL | Status: DC

## 2015-12-18 MED ORDER — SIMVASTATIN 40 MG PO TABS: ORAL_TABLET | ORAL | Status: DC

## 2015-12-18 MED ORDER — INDAPAMIDE 2.5 MG PO TABS: ORAL_TABLET | ORAL | Status: DC

## 2015-12-18 NOTE — Progress Notes (Signed)
Chief Complaint  Patient presents with  . Hypertension    nonfasting med check, labs already done.    Hypertension follow-up: Blood pressures elsewhere are 135-140/low 80's. Denies dizziness, headaches, chest pain, edema, muscle cramps. Denies side effects of medications.   Hyperlipidemia follow-up: Patient is reportedly following a low-fat, low cholesterol diet. Compliant with medications and denies medication side effects  Gout: Denies any flares. Compliant with allopurinol (previously expressed that she is not interested in trying to change to a lower dose even though uric acid levels had been low).  Smoking: "I'm really working on it".  She can often go all day without smoking, sometimes just has 3-4/day, up to 6/day, usually just 4/day, only smoking in her garage. Her husband retired, and she doesn't smoke when he is home.  Pre-diabetes:  A1c 6 months ago was 5.8. She has been watching her diet, cut back on portion sizes.  Doesn't eat sweets but once/week.  zostavax had been discussed in the past, hadn't checked with pharmacy yet, plans to.  PMH, PSH, SH reviewed.  Outpatient Encounter Prescriptions as of 12/18/2015  Medication Sig  . allopurinol (ZYLOPRIM) 100 MG tablet Take 1 tablet (100 mg total) by mouth daily.  Marland Kitchen aspirin 81 MG tablet Take 1 tablet (81 mg total) by mouth daily.  . Calcium Carbonate-Vitamin D (CALCIUM 600+D) 600-400 MG-UNIT per tablet Take 1 tablet by mouth daily.  . cholecalciferol (VITAMIN D) 1000 UNITS tablet Take 1,000 Units by mouth daily.  . dorzolamide-timolol (COSOPT) 22.3-6.8 MG/ML ophthalmic solution Place 1 drop into both eyes 2 (two) times daily.  . indapamide (LOZOL) 2.5 MG tablet Take 1 tablet by mouth  every morning  . potassium chloride 20 MEQ/15ML (10%) SOLN Take 8m 3 times a week and 143m4 times a week  . simvastatin (ZOCOR) 40 MG tablet Take 1 tablet by mouth  every evening  . [DISCONTINUED] allopurinol (ZYLOPRIM) 300 MG tablet Take 1 tablet  by mouth  daily  . [DISCONTINUED] indapamide (LOZOL) 2.5 MG tablet Take 1 tablet by mouth  every morning  . [DISCONTINUED] simvastatin (ZOCOR) 40 MG tablet Take 1 tablet by mouth  every evening   No facility-administered encounter medications on file as of 12/18/2015.   No Known Allergies  ROS: no fever, chills, URI symptoms, cough, shortness of breath, chest pain, palpitations, edema, GI or GU complaints, depression. Ongoing intermittent back/side pain.  Not worsening.  She is worried about cancer.  Also concerned that her feet stay cold. No pain, or general cold intolerance.  She also reports that her right 5th toe sometimes has discomfort, feels like it wants to separate. Denies trauma or injury.  Had prior foot surgery, but different toe.  PHYSICAL EXAM: BP 140/84 mmHg  Pulse 64  Ht _0  (1.549 m)  Wt 130 lb 6.4 oz (59.149 kg)  BMI 24.65 kg/m2  Well appearing, pleasant female in good spirits HEENT: PERRL, EOMI, conjunctiva clear Neck: no lymphadenopathy, thyromegaly or carotid bruit Heart: regular rate and rhythm Lungs: clear bilaterally Back: no spinal or CVA tenderness Abdomen: soft, nontender, no organomegaly or mass Extremities: no edema, normal pulses.  The right 5th toe feels very different than the left--flaccid/floppy, not as stiff as left, ?abnormal distal phalanges/bones?   Lab Results  Component Value Date   HGBA1C 5.6 12/16/2015   Uric acid 2.0    Chemistry      Component Value Date/Time   NA 139 12/16/2015 0001   K 3.8 12/16/2015 0001   CL  100 12/16/2015 0001   CO2 27 12/16/2015 0001   BUN 8 12/16/2015 0001   CREATININE 0.57 12/16/2015 0001      Component Value Date/Time   CALCIUM 9.2 12/16/2015 0001   ALKPHOS 72 12/16/2015 0001   AST 42* 12/16/2015 0001   ALT 14 12/16/2015 0001   BILITOT 0.5 12/16/2015 0001     Fasting glucose 82  Lab Results  Component Value Date   TSH 2.11 12/16/2015   Lab Results  Component Value Date   CHOL 110*  12/16/2015   HDL 50 12/16/2015   LDLCALC 46 12/16/2015   TRIG 69 12/16/2015   CHOLHDL 2.2 12/16/2015   Vitamin D-OH 55  ASSESSMENT/PLAN:  Essential hypertension, benign - high initially, lower on repeat.  Ok at home. continue to monitor, low sodium diet, regular exercise - Plan: indapamide (LOZOL) 2.5 MG tablet  Pure hypercholesterolemia - excellent control; continue current regimen - Plan: simvastatin (ZOCOR) 40 MG tablet  Gouty arthropathy - uric acid remains very low.  Willing to try 173m; continue current diet. Increase dose if gout recurs - Plan: allopurinol (ZYLOPRIM) 100 MG tablet  Tobacco use disorder - counseled re: cessation and encouraged her to keep trying to quit  Prediabetes - improved; continue healthy diet  Hypokalemia - adequately replaced on current regimen  Pain of toe of right foot - 5th toe; intermittent.  Visibly normal; feels soft, not as bony, flexible. h/o prior x-rays.  f/y with podiatrist if ongoing pain.wider shoes  Her back discomfort has been addressed in the past, is intermittent and not worsening.  Reassured. Last u/a 07/2014--repeat at CPE if persistent pain  Counseled further and encouraged to quit smoking.   Look into getting shingles vaccine (zostavax) at the pharmacy.  Recommended x-ray to eval 5th toe.  Has seen podiatrist in the past, had x-rays there, no results in system. F/u with podiatrist if ongoing pain.   F/u 6 months for AWV/med check+ with labs prior A1c, uric acid, CBC, c-met, lipid

## 2015-12-18 NOTE — Patient Instructions (Addendum)
Continue your current medications. We are changing the allopurinol dose to 100mg .  Let us know if you develop any gout symptoms (I truly doubt this will happen if your diet remains the same). We will recheck the uric acid at your next visit in 6 months to ensure that you remain at goal.  Your blood pressure is borderline--Ideally I would love to see 130 or less, but as long as it isn't over 140, we can continue your current medications. Continue to try and follow a low sodium diet, get daily exercise.  Continue to limit portions and watch your weight.  Look into getting shingles vaccine (zostavax) at the pharmacy.  Please try and quit smoking--come up with alternative distractions when you want a cigarette. Try to not keep any around (save your money!)

## 2016-01-07 ENCOUNTER — Telehealth: Payer: Self-pay | Admitting: Family Medicine

## 2016-01-07 NOTE — Telephone Encounter (Signed)
Rcvd refill request for Potassium Chloride

## 2016-01-08 MED ORDER — POTASSIUM CHLORIDE 20 MEQ/15ML (10%) PO SOLN: ORAL | Status: DC

## 2016-01-08 NOTE — Telephone Encounter (Signed)
Done

## 2016-02-14 ENCOUNTER — Encounter: Payer: Self-pay | Admitting: Medical

## 2016-02-14 ENCOUNTER — Ambulatory Visit (INDEPENDENT_AMBULATORY_CARE_PROVIDER_SITE_OTHER): Payer: Medicare Other | Admitting: Medical

## 2016-02-14 VITALS — BP 128/90 | HR 69 | Temp 97.5°F | Wt 131.0 lb

## 2016-02-14 DIAGNOSIS — J988 Other specified respiratory disorders: Secondary | ICD-10-CM | POA: Diagnosis not present

## 2016-02-14 MED ORDER — AMOXICILLIN 875 MG PO TABS: 875.0000 mg | ORAL_TABLET | Freq: Two times a day (BID) | ORAL | Status: DC

## 2016-02-14 NOTE — Progress Notes (Signed)
Subjective: Chief Complaint  Patient presents with  . sick    had it a week. said that she got better wednesday. but now is back congested. coughing and runny nose. clear mucous.    Here for a head cold for over a week.  Thought it was getting better, but then things worsened again.   Using OTC Flonase, delsym.   She notes fullness in nose, coughing a lot, blowing out and coughing up mucous.   Denies sore throat, ear pain, fever, no NVD.   No SOB or wheezing.  No teeth pain.   No sick contacts.   Not often sick.  She is a smoker, but not daily.  No other aggravating or relieving factors. No other complaint.  Past Medical History  Diagnosis Date  . COPD (chronic obstructive pulmonary disease) ([place])   . Gouty arthropathy     R foot  . LBP (low back pain)   . Dietary surveillance and counseling   . Prediabetes   . Vitamin D deficiency   . Hypertension     controlled  . Hyperlipidemia 1997  . Glaucoma     Dr. Ellie Lunch  . Hx of adenomatous colonic polyps 2012    Dr. Earlean Shawl  . Internal hemorrhoids 2012  . Diverticulosis 2012  . Postmenopausal   . Osteopenia 04/2010    (DEXA ordered by Dr. Ubaldo Glassing, scheduled for 04/2012)  . Smoker    ROS as in subjective   Objective: BP 128/90 mmHg  Pulse 69  Temp(Src) 97.5 F (36.4 C) (Tympanic)  Wt 131 lb (59.421 kg)  General appearance: alert, no distress, WD/WN, mildly ill appearing HEENT: normocephalic, sclerae anicteric, conjunctiva pink and moist, TMs pearly, nares patent, no discharge or erythema, pharynx normal, tonsils unremarkable Oral cavity: MMM, no lesions Neck: supple, no lymphadenopathy, no thyromegaly, no masses Heart: RRR, normal S1, S2, no murmurs Lungs: CTA bilaterally, no wheezes, rhonchi, or rales Pulses: 2+ symmetric    Assessment: Encounter Diagnosis  Name Primary?  Marland Kitchen Respiratory tract infection Yes      Plan: Discussed symptoms, findings, recommendations.   Patient Instructions   Encounter Diagnosis  Name  Primary?  Marland Kitchen Respiratory tract infection Yes   Recommendations  Rest   Hydrate well with water  continue Delsym cough syrup  Continue Flonase nasal  Consider nasal saline flush such as Neti Pot  Begin OTC Mucinex or Guaifenesin to help break loose mucous OR you can use benadryl antihistamine at bedtime to help dry up congestion  If not improving or worse over the next 2-3 days, then begin Amoxicillin antibiotic for possible early sinus infection  Kiera was seen today for sick.  Diagnoses and all orders for this visit:  Respiratory tract infection  Other orders -     amoxicillin (AMOXIL) 875 MG tablet; Take 1 tablet (875 mg total) by mouth 2 (two) times daily.

## 2016-02-14 NOTE — Patient Instructions (Signed)
Encounter Diagnosis  Name Primary?  Marland Kitchen Respiratory tract infection Yes   Recommendations  Rest   Hydrate well with water  continue Delsym cough syrup  Continue Flonase nasal  Consider nasal saline flush such as Neti Pot  Begin OTC Mucinex or Guaifenesin to help break loose mucous OR you can use benadryl antihistamine at bedtime to help dry up congestion  If not improving or worse over the next 2-3 days, then begin Amoxicillin antibiotic for possible early sinus infection

## 2016-04-13 ENCOUNTER — Encounter: Payer: Self-pay | Admitting: Family Medicine

## 2016-04-13 DIAGNOSIS — D125 Benign neoplasm of sigmoid colon: Secondary | ICD-10-CM | POA: Diagnosis not present

## 2016-04-13 DIAGNOSIS — Z86010 Personal history of colonic polyps: Secondary | ICD-10-CM | POA: Diagnosis not present

## 2016-04-13 DIAGNOSIS — D124 Benign neoplasm of descending colon: Secondary | ICD-10-CM | POA: Diagnosis not present

## 2016-04-13 DIAGNOSIS — K641 Second degree hemorrhoids: Secondary | ICD-10-CM | POA: Diagnosis not present

## 2016-04-13 DIAGNOSIS — K5730 Diverticulosis of large intestine without perforation or abscess without bleeding: Secondary | ICD-10-CM | POA: Diagnosis not present

## 2016-04-13 DIAGNOSIS — Z1211 Encounter for screening for malignant neoplasm of colon: Secondary | ICD-10-CM | POA: Diagnosis not present

## 2016-04-13 LAB — HM COLONOSCOPY

## 2016-04-15 ENCOUNTER — Other Ambulatory Visit: Payer: Self-pay | Admitting: Family Medicine

## 2016-04-15 DIAGNOSIS — E7800 Pure hypercholesterolemia, unspecified: Secondary | ICD-10-CM

## 2016-04-15 DIAGNOSIS — M109 Gout, unspecified: Secondary | ICD-10-CM

## 2016-04-15 DIAGNOSIS — I10 Essential (primary) hypertension: Secondary | ICD-10-CM

## 2016-04-16 ENCOUNTER — Encounter: Payer: Self-pay | Admitting: *Deleted

## 2016-04-21 ENCOUNTER — Other Ambulatory Visit: Payer: Self-pay | Admitting: Family Medicine

## 2016-04-21 DIAGNOSIS — Z1231 Encounter for screening mammogram for malignant neoplasm of breast: Secondary | ICD-10-CM

## 2016-04-28 DIAGNOSIS — H401131 Primary open-angle glaucoma, bilateral, mild stage: Secondary | ICD-10-CM | POA: Diagnosis not present

## 2016-05-27 ENCOUNTER — Ambulatory Visit
Admission: RE | Admit: 2016-05-27 | Discharge: 2016-05-27 | Disposition: A | Discharge status: Home / Self Care | Payer: Medicare Other | Source: Ambulatory Visit | Attending: Family Medicine | Admitting: Family Medicine

## 2016-05-27 DIAGNOSIS — Z1231 Encounter for screening mammogram for malignant neoplasm of breast: Secondary | ICD-10-CM | POA: Diagnosis not present

## 2016-08-05 ENCOUNTER — Other Ambulatory Visit: Payer: Medicare Other

## 2016-08-05 DIAGNOSIS — R7303 Prediabetes: Secondary | ICD-10-CM

## 2016-08-05 DIAGNOSIS — E876 Hypokalemia: Secondary | ICD-10-CM

## 2016-08-05 DIAGNOSIS — M109 Gout, unspecified: Secondary | ICD-10-CM | POA: Diagnosis not present

## 2016-08-05 DIAGNOSIS — R7301 Impaired fasting glucose: Secondary | ICD-10-CM

## 2016-08-05 DIAGNOSIS — I10 Essential (primary) hypertension: Secondary | ICD-10-CM

## 2016-08-05 DIAGNOSIS — E7800 Pure hypercholesterolemia, unspecified: Secondary | ICD-10-CM

## 2016-08-05 DIAGNOSIS — Z5181 Encounter for therapeutic drug level monitoring: Secondary | ICD-10-CM

## 2016-08-05 LAB — COMPREHENSIVE METABOLIC PANEL
ALBUMIN: 4 g/dL (ref 3.6–5.1)
ALK PHOS: 77 U/L (ref 33–130)
ALT: 13 U/L (ref 6–29)
AST: 28 U/L (ref 10–35)
BILIRUBIN TOTAL: 0.4 mg/dL (ref 0.2–1.2)
BUN: 8 mg/dL (ref 7–25)
CALCIUM: 9.6 mg/dL (ref 8.6–10.4)
CO2: 27 mmol/L (ref 20–31)
CREATININE: 0.73 mg/dL (ref 0.50–0.99)
Chloride: 100 mmol/L (ref 98–110)
Glucose, Bld: 99 mg/dL (ref 65–99)
Potassium: 3.6 mmol/L (ref 3.5–5.3)
Sodium: 137 mmol/L (ref 135–146)
TOTAL PROTEIN: 7.4 g/dL (ref 6.1–8.1)

## 2016-08-05 LAB — CBC WITH DIFFERENTIAL/PLATELET
BASOS ABS: 0 {cells}/uL (ref 0–200)
Basophils Relative: 0 %
EOS PCT: 0 %
Eosinophils Absolute: 0 cells/uL — ABNORMAL LOW (ref 15–500)
HCT: 36 % (ref 35.0–45.0)
HEMOGLOBIN: 12.3 g/dL (ref 11.7–15.5)
LYMPHS ABS: 1296 {cells}/uL (ref 850–3900)
LYMPHS PCT: 24 %
MCH: 33.5 pg — AB (ref 27.0–33.0)
MCHC: 34.2 g/dL (ref 32.0–36.0)
MCV: 98.1 fL (ref 80.0–100.0)
MONOS PCT: 8 %
MPV: 9.5 fL (ref 7.5–12.5)
Monocytes Absolute: 432 cells/uL (ref 200–950)
NEUTROS PCT: 68 %
Neutro Abs: 3672 cells/uL (ref 1500–7800)
Platelets: 257 10*3/uL (ref 140–400)
RBC: 3.67 MIL/uL — ABNORMAL LOW (ref 3.80–5.10)
RDW: 13 % (ref 11.0–15.0)
WBC: 5.4 10*3/uL (ref 4.0–10.5)

## 2016-08-05 LAB — LIPID PANEL
CHOLESTEROL: 133 mg/dL (ref <200)
HDL: 47 mg/dL — ABNORMAL LOW (ref >50)
LDL Cholesterol: 72 mg/dL (ref <100)
Total CHOL/HDL Ratio: 2.8 Ratio (ref <5.0)
Triglycerides: 71 mg/dL (ref <150)
VLDL: 14 mg/dL (ref <30)

## 2016-08-05 LAB — URIC ACID: Uric Acid, Serum: 4.3 mg/dL (ref 2.5–7.0)

## 2016-08-06 LAB — HEMOGLOBIN A1C
HEMOGLOBIN A1C: 5.6 % (ref <5.7)
MEAN PLASMA GLUCOSE: 114 mg/dL

## 2016-08-10 NOTE — Progress Notes (Signed)
Chief Complaint  Patient presents with  . Medicare Wellness    nonfasting (labs already done) AWV/Med check plus with pelvic. No concerns.   . Flu Vaccine    declined.    Bailey Parker is a 65 y.o. female who presents for annual wellness visit and follow-up on chronic medical conditions.  She has no complaints.  Hypertension follow-up: Blood pressures elsewhere are 135-140/low 80's (she reports seeing higher numbers than what we got here today--checks at pharmacy, when rushed, doesn't wait and recheck). Denies dizziness, headaches, chest pain, edema, muscle cramps. Denies side effects of medications.   Hyperlipidemia follow-up: Patient is reportedly following a low-fat, low cholesterol diet. Compliant with medications and denies medication side effects  Gout: Denies any flares. Compliant with allopurinol--dose was lowered to 128m (when uric acid levels were consistently low, last check in Spring was 2).  She denies any flare of gout or joint pains since the dose change.  Smoking: She can often go all day without smoking, sometimes just has 3-4/day, some days has none, but up to 6/day, only smoking in her garage. Her husband retired, and she doesn't smoke when he is home.  Pre-diabetes:  A1c 6 months ago was 5.6, prior to that had been 5.8. She continues to watch her diet (limit sweets, portions). She isn't getting regular exercise.  Immunization History  Administered Date(s) Administered  . Pneumococcal Conjugate-13 01/22/2014  . Pneumococcal Polysaccharide-23 06/19/2015  . Td 08/03/1995  . Tdap 02/02/2006   refuses flu shots Last Pap smear: 05/2015--normal; no high risk HPV detected Last mammogram: 05/2016 Last colonoscopy: 03/2016; polyps. Due again 5 years Last DEXA: 2011 per Dr. LNestor Rampare in old chart, and show T-1.1 at femur (unchanged from 2004).  Dentist: 2x/year, cleanings 4x/year Ophtho: twice yearly  Exercise: no regular exercise currently.  Other  doctors caring for patient include: [facility] Dentist: Dr. TRadford PaxGI: Dr. MEarlean ShawlPodiatrist ([facility]  Depression, fall and ADL screens are negative. See epic questionnaires.  End of Life Discussion:  Patient has a living will and medical power of attorney  Past Medical History:  Diagnosis Date  . COPD (chronic obstructive pulmonary disease) ([facility]   . Dietary surveillance and counseling   . Diverticulosis 2012  . Glaucoma    Dr. MEllie Lunch . Gouty arthropathy    R foot  . Hx of adenomatous colonic polyps 2012   Dr. MEarlean Shawl . Hyperlipidemia 1997  . Hypertension    controlled  . Internal hemorrhoids 2012  . LBP (low back pain)   . Osteopenia 04/2010   (DEXA ordered by Dr. LUbaldo Glassing scheduled for 04/2012)  . Postmenopausal   . Prediabetes   . Smoker   . Vitamin D deficiency     Past Surgical History:  Procedure Laterality Date  . APPENDECTOMY  1966  . BAND HEMORRHOIDECTOMY  06/2011   Dr. MEarlean Shawl . COLONOSCOPY  03/2011   Dr. MEarlean Shawl . TUBAL LIGATION  1980    Social History   Social History  . Marital status: Married    Spouse name: N/A  . Number of children: 2  . Years of education: N/A   Occupational History  . retired (Transport planner    Social History Main Topics  . Smoking status: Current Every Day Smoker    Packs/day: 0.30    Years: 40.00    Types: Cigarettes  . Smokeless tobacco: Never Used     Comment: cut back to 0-6, usually 3-4  cigarettes/day (07/2016)  . Alcohol use 0.0 oz/week     Comment: 2 drinks per week.  . Drug use: No  . Sexual activity: Yes    Partners: Male   Other Topics Concern  . Not on file   Social History Narrative   Lives with husband.  1 daughter in [place], the other daughter lives down the street. 1 stepdaughter (who has 4 children), all local. 2 stepgrandchildren (from her local daughter). 1 great grandson (stepgrandchild's son)    Family History  Problem Relation Age of Onset  . Alzheimer's disease  Mother   . Hypertension Mother   . Stroke Mother 41  . Heart disease Father   . Diabetes Father   . Hypertension Brother   . Diabetes Brother   . Cancer Neg Hx     Outpatient Encounter Prescriptions as of 08/12/2016  Medication Sig  . allopurinol (ZYLOPRIM) 100 MG tablet Take 1 tablet by mouth  daily  . aspirin 81 MG tablet Take 1 tablet (81 mg total) by mouth daily.  . Calcium Carbonate-Vitamin D (CALCIUM 600+D) 600-400 MG-UNIT per tablet Take 1 tablet by mouth daily.  . cholecalciferol (VITAMIN D) 1000 UNITS tablet Take 1,000 Units by mouth daily.  . dorzolamide-timolol (COSOPT) 22.3-6.8 MG/ML ophthalmic solution Place 1 drop into both eyes 2 (two) times daily.  . indapamide (LOZOL) 2.5 MG tablet Take 1 tablet by mouth  every morning  . potassium chloride 20 MEQ/15ML (10%) SOLN Take 37m 3 times a week and 136m4 times a week  . simvastatin (ZOCOR) 40 MG tablet Take 1 tablet by mouth  every evening  . [DISCONTINUED] amoxicillin (AMOXIL) 875 MG tablet Take 1 tablet (875 mg total) by mouth 2 (two) times daily.   No facility-administered encounter medications on file as of 08/12/2016.     No Known Allergies   ROS: The patient denies anorexia, fever, weight changes, headaches, vision changes, decreased hearing, ear pain, sore throat, breast concerns, chest pain, palpitations, dizziness, syncope, dyspnea on exertion, cough, swelling, nausea, vomiting, diarrhea, constipation, abdominal pain, melena, hematochezia, indigestion/heartburn, hematuria, incontinence, dysuria, vaginal bleeding, discharge, odor or itch, genital lesions, joint pains, numbness, tingling, weakness, tremor, suspicious skin lesions, depression, anxiety, abnormal bleeding/bruising, or enlarged lymph nodes. Slight discomfort intermittently in her lower back (not recently).    PHYSICAL EXAM:  BP 122/70 (BP Location: Left Arm, Patient Position: Sitting, Cuff Size: Normal)   Pulse 76   Ht 5' (1.524 m)   Wt 130 lb 3.2  oz (59.1 kg)   BMI 25.43 kg/m    General Appearance:  Alert, cooperative, no distress, appears stated age   Head:  Normocephalic, without obvious abnormality, atraumatic   Eyes:  PERRL, conjunctiva/corneas clear, EOM's intact, fundi benign   Ears:  Normal TM's and external ear canals   Nose:  Nares normal, mucosa is normal, no drainage or sinus tenderness   Throat:  Lips, mucosa, and tongue normal; teeth and gums normal. Torus pallatini superiorly  Neck:  Supple, no lymphadenopathy; thyroid: no enlargement/tenderness/nodules; no carotid bruit or JVD. Slightly prominent submandibular glands noted, symmetric  Back:  Spine nontender, no curvature, ROM normal, no CVA tenderness.   Lungs:  Clear to auscultation bilaterally without wheezes, rales or ronchi; respirations unlabored   Chest Wall:  No tenderness or deformity   Heart:  Regular rate and rhythm, S1 and S2 normal, no murmur, rub or gallop   Breast Exam:  No tenderness, masses, or nipple discharge or inversion. No axillary lymphadenopathy. Large, pendulous  breasts   Abdomen:  Soft, non-tender, nondistended, normoactive bowel sounds,  no masses, no hepatosplenomegaly   Genitalia:  Normal external genitalia without lesions. BUS and vagina normal; no cervical motion tenderness. No abnormal vaginal discharge. Uterus and adnexa not enlarged, nontender, no masses. Pap not performed   Rectal:  Normal tone, no masses or tenderness; guaiac negative stool   Extremities:  No clubbing, cyanosis or edema   Pulses:  2+ and symmetric all extremities   Skin:  Skin color, texture, turgor normal, no rashes or lesions   Lymph nodes:  Cervical, supraclavicular, and axillary nodes normal   Neurologic:  CNII-XII intact, normal strength, sensation and gait; reflexes 2+ and symmetric throughout   Psych:  Normal mood, affect, hygiene and grooming   Lab Results  Component Value Date   HGBA1C 5.6  08/05/2016     Chemistry      Component Value Date/Time   NA 137 08/05/2016 0739   K 3.6 08/05/2016 0739   CL 100 08/05/2016 0739   CO2 27 08/05/2016 0739   BUN 8 08/05/2016 0739   CREATININE 0.73 08/05/2016 0739      Component Value Date/Time   CALCIUM 9.6 08/05/2016 0739   ALKPHOS 77 08/05/2016 0739   AST 28 08/05/2016 0739   ALT 13 08/05/2016 0739   BILITOT 0.4 08/05/2016 0739     Fasting glucose 99 Vitamin D-OH 55  Lab Results  Component Value Date   WBC 5.4 08/05/2016   HGB 12.3 08/05/2016   HCT 36.0 08/05/2016   MCV 98.1 08/05/2016   PLT 257 08/05/2016   Lab Results  Component Value Date   LABURIC 4.3 08/05/2016     ASSESSMENT/PLAN:  Medicare annual wellness visit, initial  Essential hypertension, benign - well controlled today; higher at pharmacy--advised to rest and recheck if reading is high at pharmacy.  - Plan: indapamide (LOZOL) 2.5 MG tablet  Gouty arthropathy - uric acid remains at goal on the lower dose of allopurinol, without any recurrent gout flares  Pure hypercholesterolemia - at goal.  higher than in past, but still at goal.  reviewed diet - Plan: simvastatin (ZOCOR) 40 MG tablet  Prediabetes - resolved with improvementin diet.  A1c remains <5.7  Tobacco use disorder - counseled extensively about risks, reasons to quit, and how she should try.   Counseled again re: smoking cessation. (don't buy cigarettes; exercise instead of smoking; keep a jar to save the money for herself that she would spent on cigarettes, etc).   Discussed monthly self breast exams and yearly mammograms; at least 30 minutes of aerobic activity at least 5 days/week and weight-bearing exercise 2x/week; proper sunscreen use reviewed; healthy diet, including goals of calcium and vitamin D intake and alcohol recommendations (less than or equal to 1 drink/day) reviewed; regular seatbelt use; changing batteries in smoke detectors.  Immunization recommendations discussed. High  dose flu shots are recommended yearly--she declines.  Td is needed--not covered by Medicare, to get at pharmacy (vs paying the $98 out of pocket to get from our office--she will check price at pharmacy and then decide. Given written rx for Td for pharmacy). Discussed shingles vaccines (Shingrix, Zostavax)--needs to get from pharmacy; Shingrix is preferred, not yet available, will need to check on cost/coverage.  Colonoscopy recommendations reviewed, UTD. DEXA with next mammogram (due 05/2017)   F/u 6 months with fasting labs prior c-met, lipids, uric acid, TSH   Pt states she just got 90d supply of allopurinol and has plenty of potassium.  Will have pharmacy contact us when refills are needed for these medications.  Medicare Attestation I have personally reviewed: The patient's medical and social history Their use of alcohol, tobacco or illicit drugs Their current medications and supplements The patient's functional ability including ADLs,fall risks, home safety risks, cognitive, and hearing and visual impairment Diet and physical activities Evidence for depression or mood disorders  The patient's weight, height, and BMI have been recorded in the chart.  I have made referrals, counseling, and provided education to the patient based on review of the above and I have provided the patient with a written personalized care plan for preventive services.     Prairie Stenberg A, MD   08/10/2016

## 2016-08-12 ENCOUNTER — Encounter: Payer: Self-pay | Admitting: Family Medicine

## 2016-08-12 ENCOUNTER — Ambulatory Visit (INDEPENDENT_AMBULATORY_CARE_PROVIDER_SITE_OTHER): Payer: Medicare Other | Admitting: Family Medicine

## 2016-08-12 VITALS — BP 122/70 | HR 76 | Ht 60.0 in | Wt 130.2 lb

## 2016-08-12 DIAGNOSIS — Z5181 Encounter for therapeutic drug level monitoring: Secondary | ICD-10-CM

## 2016-08-12 DIAGNOSIS — E7800 Pure hypercholesterolemia, unspecified: Secondary | ICD-10-CM

## 2016-08-12 DIAGNOSIS — M8580 Other specified disorders of bone density and structure, unspecified site: Secondary | ICD-10-CM

## 2016-08-12 DIAGNOSIS — M109 Gout, unspecified: Secondary | ICD-10-CM | POA: Diagnosis not present

## 2016-08-12 DIAGNOSIS — Z780 Asymptomatic menopausal state: Secondary | ICD-10-CM | POA: Diagnosis not present

## 2016-08-12 DIAGNOSIS — F17200 Nicotine dependence, unspecified, uncomplicated: Secondary | ICD-10-CM | POA: Diagnosis not present

## 2016-08-12 DIAGNOSIS — Z0000 Encounter for general adult medical examination without abnormal findings: Secondary | ICD-10-CM

## 2016-08-12 DIAGNOSIS — R7303 Prediabetes: Secondary | ICD-10-CM | POA: Diagnosis not present

## 2016-08-12 DIAGNOSIS — I10 Essential (primary) hypertension: Secondary | ICD-10-CM

## 2016-08-12 MED ORDER — SIMVASTATIN 40 MG PO TABS: 40.0000 mg | ORAL_TABLET | Freq: Every evening | ORAL | 1 refills | Status: DC

## 2016-08-12 MED ORDER — INDAPAMIDE 2.5 MG PO TABS: 2.5000 mg | ORAL_TABLET | Freq: Every morning | ORAL | 1 refills | Status: DC

## 2016-08-12 NOTE — Patient Instructions (Addendum)
HEALTH MAINTENANCE RECOMMENDATIONS:  It is recommended that you get at least 30 minutes of aerobic exercise at least 5 days/week (for weight loss, you may need as much as 60-90 minutes). This can be any activity that gets your heart rate up. This can be divided in 10-15 minute intervals if needed, but try and build up your endurance at least once a week.  Weight bearing exercise is also recommended twice weekly.  Eat a healthy diet with lots of vegetables, fruits and fiber.  "Colorful" foods have a lot of vitamins (ie green vegetables, tomatoes, red peppers, etc).  Limit sweet tea, regular sodas and alcoholic beverages, all of which has a lot of calories and sugar.  Up to 1 alcoholic drink daily may be beneficial for women (unless trying to lose weight, watch sugars).  Drink a lot of water.  Calcium recommendations are 1200-1500 mg daily (1500 mg for postmenopausal women or women without ovaries), and vitamin D 1000 IU daily.  This should be obtained from diet and/or supplements (vitamins), and calcium should not be taken all at once, but in divided doses.  Monthly self breast exams and yearly mammograms for women over the age of 81 is recommended.  Sunscreen of at least SPF 30 should be used on all sun-exposed parts of the skin when outside between the hours of 10 am and 4 pm (not just when at beach or pool, but even with exercise, golf, tennis, and yard work!)  Use a sunscreen that says "broad spectrum" so it covers both UVA and UVB rays, and make sure to reapply every 1-2 hours.  Remember to change the batteries in your smoke detectors when changing your clock times in the spring and fall.  Use your seat belt every time you are in a car, and please drive safely and not be distracted with cell phones and texting while driving.    Bailey Parker , Thank you for taking time to come for your Medicare Wellness Visit. I appreciate your ongoing commitment to your health goals. Please review the  following plan we discussed and let me know if I can assist you in the future.   These are the goals we discussed: Goals    None      This is a list of the screening recommended for you and due dates:  Health Maintenance  Topic Date Due  . Shingles Vaccine  05/02/2009  . Tetanus Vaccine  02/03/2016  . Flu Shot  12/19/2016*  . Mammogram  05/27/2018  . Colon Cancer Screening  04/13/2021  . DEXA scan (bone density measurement)  Completed  .  Hepatitis C: One time screening is recommended by Center for Disease Control  (CDC) for  adults born from [birth year] through 1967.   Completed  . Pneumonia vaccines  Completed  *Topic was postponed. The date shown is not the original due date.   I recommend looking into the new shingles vaccine, called Shingrix, to see if your insurance covers this.  It is a new vaccine, requires 2 injections, and is better than the older Zostavax.  These likely will be covered (at least partially) only if received at the pharmacy. Yearly flu shots are recommended. Mammograms are recommended yearly (due 05/2017, NOT 05/2018 as stated above). Let's have you schedule a repeat bone density test when you get your next mammogram.  It has been many years since your last one, which had shown very slight thinning.  Tetanus is due now. It likely will be less  expensive for you to get at the pharmacy, rather than here.  It will cost you $98 for [place] to give it in the office (Medicare doesn't pay for it--it is covered through your pharmacy benefit, to be received at the pharmacy).  Please let us know the date you get it at the pharmacy, or return for a nurse visit if you prefer to pay out of pocket and get it here.  Work up to the 150 minutes of aerobic exercise as we discussed. And work on QUITTING SMOKING as we also discussed.  You can do this! (stop buying them...)

## 2016-09-30 ENCOUNTER — Other Ambulatory Visit (INDEPENDENT_AMBULATORY_CARE_PROVIDER_SITE_OTHER): Payer: Medicare Other

## 2016-09-30 DIAGNOSIS — Z23 Encounter for immunization: Secondary | ICD-10-CM | POA: Diagnosis not present

## 2016-10-19 ENCOUNTER — Other Ambulatory Visit: Payer: Self-pay | Admitting: Family Medicine

## 2016-10-19 DIAGNOSIS — I10 Essential (primary) hypertension: Secondary | ICD-10-CM

## 2016-10-30 ENCOUNTER — Other Ambulatory Visit: Payer: Self-pay | Admitting: Family Medicine

## 2016-10-30 DIAGNOSIS — M109 Gout, unspecified: Secondary | ICD-10-CM

## 2016-10-30 NOTE — Telephone Encounter (Signed)
Is this okay to refill? 

## 2016-11-03 DIAGNOSIS — H2513 Age-related nuclear cataract, bilateral: Secondary | ICD-10-CM | POA: Diagnosis not present

## 2016-11-03 DIAGNOSIS — H401131 Primary open-angle glaucoma, bilateral, mild stage: Secondary | ICD-10-CM | POA: Diagnosis not present

## 2016-11-03 DIAGNOSIS — H5213 Myopia, bilateral: Secondary | ICD-10-CM | POA: Diagnosis not present

## 2017-01-19 ENCOUNTER — Other Ambulatory Visit: Payer: Self-pay | Admitting: Family Medicine

## 2017-01-19 DIAGNOSIS — M109 Gout, unspecified: Secondary | ICD-10-CM

## 2017-02-08 ENCOUNTER — Other Ambulatory Visit: Payer: Medicare Other

## 2017-02-08 DIAGNOSIS — M8580 Other specified disorders of bone density and structure, unspecified site: Secondary | ICD-10-CM

## 2017-02-08 DIAGNOSIS — I10 Essential (primary) hypertension: Secondary | ICD-10-CM | POA: Diagnosis not present

## 2017-02-08 DIAGNOSIS — Z5181 Encounter for therapeutic drug level monitoring: Secondary | ICD-10-CM

## 2017-02-08 DIAGNOSIS — M109 Gout, unspecified: Secondary | ICD-10-CM

## 2017-02-08 DIAGNOSIS — R7303 Prediabetes: Secondary | ICD-10-CM | POA: Diagnosis not present

## 2017-02-08 DIAGNOSIS — E7800 Pure hypercholesterolemia, unspecified: Secondary | ICD-10-CM | POA: Diagnosis not present

## 2017-02-09 LAB — TSH: TSH: 1.36 mIU/L

## 2017-02-09 LAB — COMPREHENSIVE METABOLIC PANEL
ALK PHOS: 75 U/L (ref 33–130)
ALT: 12 U/L (ref 6–29)
AST: 20 U/L (ref 10–35)
Albumin: 3.9 g/dL (ref 3.6–5.1)
BUN: 8 mg/dL (ref 7–25)
CALCIUM: 9.4 mg/dL (ref 8.6–10.4)
CO2: 26 mmol/L (ref 20–31)
Chloride: 102 mmol/L (ref 98–110)
Creat: 0.56 mg/dL (ref 0.50–0.99)
Glucose, Bld: 88 mg/dL (ref 65–99)
POTASSIUM: 3.1 mmol/L — AB (ref 3.5–5.3)
Sodium: 140 mmol/L (ref 135–146)
TOTAL PROTEIN: 6.8 g/dL (ref 6.1–8.1)
Total Bilirubin: 0.3 mg/dL (ref 0.2–1.2)

## 2017-02-09 LAB — LIPID PANEL
CHOL/HDL RATIO: 2.8 ratio (ref <5.0)
CHOLESTEROL: 117 mg/dL (ref <200)
HDL: 42 mg/dL — ABNORMAL LOW (ref >50)
LDL Cholesterol: 56 mg/dL (ref <100)
Triglycerides: 94 mg/dL (ref <150)
VLDL: 19 mg/dL (ref <30)

## 2017-02-09 LAB — URIC ACID: URIC ACID, SERUM: 4.3 mg/dL (ref 2.5–7.0)

## 2017-02-09 NOTE — Progress Notes (Signed)
Chief Complaint  Patient presents with  . Hypertension    nonfasting med check, labs already done.   . hemorrhoid    noticed a place in her rectal area that may be a hemorrhoid when she was wiping after a bowek movement. No bleeding no discomfort-just there.    She has noticed a lump in her rectal area. It is not painful, just notices it when she wipes.  Denies any bleeding. Denies constipation, straining.  She used Tuck's last night, and Preparation H yesterday.  Hypertension follow-up: Blood pressures elsewhere are 140/low 80's (admits to being somewhat rushed when checking at the pharmacy). Denies dizziness, headaches, chest pain, edema, muscle cramps. Denies side effects of medications.   Hypokalemia--related to diuretic use.  She is using 40 mEq on Mon, Tues, Wed and 20 mEq all the other days.  Last labs were checked this past Monday, and potassium was low (see below), after having taken the lower amount for the few days prior.  Hyperlipidemia follow-up: Patient is reportedly following a low-fat, low cholesterol diet. Compliant with medications and denies medication side effects  Gout: Denies any flares. Compliant with allopurinol--dose was lowered to 18m (when uric acid levels were consistently low, last check in Spring was 2).  She denies any flare of gout or joint pains since the dose change.  Smoking:  Admits to still smoking some.  On a "bad day" she smokes 6, some days none at all, more commonly about 3/day. Her husband retired, and she doesn't smoke when he is home, only in the garage.  H/o pre-diabetes:  She continues to watch her diet (limit sweets, portions).Last check of A1c was normal.  She walks 3x/week Lab Results  Component Value Date   HGBA1C 5.6 08/05/2016   PMH, PSH, SH reviewed  Outpatient Encounter Prescriptions as of 02/10/2017  Medication Sig  . allopurinol (ZYLOPRIM) 100 MG tablet TAKE 1 TABLET BY MOUTH  DAILY  . aspirin 81 MG tablet Take 1 tablet (81  mg total) by mouth daily.  . Calcium Carbonate-Vitamin D (CALCIUM 600+D) 600-400 MG-UNIT per tablet Take 1 tablet by mouth daily.  . cholecalciferol (VITAMIN D) 1000 UNITS tablet Take 1,000 Units by mouth daily.  . dorzolamide-timolol (COSOPT) 22.3-6.8 MG/ML ophthalmic solution Place 1 drop into both eyes 2 (two) times daily.  . indapamide (LOZOL) 2.5 MG tablet Take 1 tablet (2.5 mg total) by mouth every morning.  . potassium chloride 20 MEQ/15ML (10%) SOLN TAKE 30ML 3 TIMES A WEEK Mon, Wed, Fri)  AND 15ML 4 TIMES A WEEK (on the other days)  . simvastatin (ZOCOR) 40 MG tablet Take 1 tablet (40 mg total) by mouth every evening.  . [DISCONTINUED] indapamide (LOZOL) 2.5 MG tablet TAKE 1 TABLET BY MOUTH  EVERY MORNING  . [DISCONTINUED] potassium chloride 20 MEQ/15ML (10%) SOLN TAKE 30ML 3 TIMES A WEEK  AND 15ML 4 TIMES A WEEK  . [DISCONTINUED] simvastatin (ZOCOR) 40 MG tablet Take 1 tablet (40 mg total) by mouth every evening.   No facility-administered encounter medications on file as of 02/10/2017.    No Known Allergies  ROS:  Denies fever, chills, URI or allergy complaints, nausea, vomiting, diarrhea, constipation, blood in stools, urinary complaints. Bleeding, bruising, rashes, chest pain, palpitations, shortness of breath, mood changes.   PHYSICAL EXAM:  BP 138/84 (BP Location: Left Arm, Patient Position: Sitting, Cuff Size: Normal)   Pulse 72   Ht 5' (1.524 m)   Wt 130 lb 12.8 oz (59.3 kg)   BMI  25.55 kg/m   Wt Readings from Last 3 Encounters:  02/10/17 130 lb 12.8 oz (59.3 kg)  08/12/16 130 lb 3.2 oz (59.1 kg)  02/14/16 131 lb (59.4 kg)    136/82 on repeat by MD  Well appearing, pleasant female in good spirits HEENT: PERRL, EOMI, conjunctiva clear Neck: no lymphadenopathy, thyromegaly or carotid bruit Heart: regular rate and rhythm Lungs: clear bilaterally Back: no spinal or CVA tenderness Abdomen: soft, nontender, no organomegaly or mass Extremities: no edema, normal  pulses.  Rectal: small hemorrhoidal tags/tissue bilaterally, but the tissue on the right side is a little firmer.  Nontender.  Psych: normal mood, affect, hygiene and grooming Neuro: alert and oriented, cranial nerves intact, normal gait   Lab Results  Component Value Date   CHOL 117 02/08/2017   HDL 42 (L) 02/08/2017   LDLCALC 56 02/08/2017   TRIG 94 02/08/2017   CHOLHDL 2.8 02/08/2017     Chemistry      Component Value Date/Time   NA 140 02/08/2017 0829   K 3.1 (L) 02/08/2017 0829   CL 102 02/08/2017 0829   CO2 26 02/08/2017 0829   BUN 8 02/08/2017 0829   CREATININE 0.56 02/08/2017 0829      Component Value Date/Time   CALCIUM 9.4 02/08/2017 0829   ALKPHOS 75 02/08/2017 0829   AST 20 02/08/2017 0829   ALT 12 02/08/2017 0829   BILITOT 0.3 02/08/2017 0829     Fasting glucose 88  Lab Results  Component Value Date   TSH 1.36 02/08/2017   Lab Results  Component Value Date   LABURIC 4.3 02/08/2017     ASSESSMENT/PLAN:  Essential hypertension, benign - ok/borderline. (slightly higher at pharmacy, when rushed). Continue current meds - Plan: indapamide (LOZOL) 2.5 MG tablet  Pure hypercholesterolemia - at goal - Plan: simvastatin (ZOCOR) 40 MG tablet  Hypokalemia - related to diuretic use; suspect related to how she is taking it--encouraged to do higher dose M/W/F, lower dose other days. Potassium rich foods discussed - Plan: potassium chloride 20 MEQ/15ML (10%) SOLN  Osteopenia, unspecified location - recommended repeat DEXA with next mammogram, in 05/2017  Gouty arthropathy - well controlled on low dose allopurinol, continue  Tobacco use disorder - encouraged complete cessation. Risks reviewed; counseled   Shingrix recommended. Risks/side effects reviewed.  reassurred regarding rectal swelling--mild hemorrhoid (cannot r/o small cystic area)--completely nontender.  Warm compresses/Sitz baths prn if becomes painful.   F/u AWV 6 mos with labs prior c-met,  CBC No need to recheck uric acid, TSH or lipids--normal now, check yearly.    Change how you are taking your potassium supplement.  Try and take the larger amount on Monday, Wednesday and Friday (rather than 3 days in a row), and then smaller amount on the other 4 days/week. By alternating, hopefully your potassium level will stay within the normal range. It has been okay in the past, likely related to the day of the week we checked it.   STOP BUYING CIGARETTES! You have done a great job in cutting back, and not smoking on a daily.  I need you to stop completely, and not having them around will make it easier for you.

## 2017-02-10 ENCOUNTER — Ambulatory Visit (INDEPENDENT_AMBULATORY_CARE_PROVIDER_SITE_OTHER): Payer: Medicare Other | Admitting: Family Medicine

## 2017-02-10 ENCOUNTER — Encounter: Payer: Self-pay | Admitting: Family Medicine

## 2017-02-10 VITALS — BP 138/84 | HR 72 | Ht 60.0 in | Wt 130.8 lb

## 2017-02-10 DIAGNOSIS — M8580 Other specified disorders of bone density and structure, unspecified site: Secondary | ICD-10-CM | POA: Diagnosis not present

## 2017-02-10 DIAGNOSIS — E7800 Pure hypercholesterolemia, unspecified: Secondary | ICD-10-CM | POA: Diagnosis not present

## 2017-02-10 DIAGNOSIS — M109 Gout, unspecified: Secondary | ICD-10-CM

## 2017-02-10 DIAGNOSIS — F17200 Nicotine dependence, unspecified, uncomplicated: Secondary | ICD-10-CM

## 2017-02-10 DIAGNOSIS — Z5181 Encounter for therapeutic drug level monitoring: Secondary | ICD-10-CM

## 2017-02-10 DIAGNOSIS — E876 Hypokalemia: Secondary | ICD-10-CM

## 2017-02-10 DIAGNOSIS — I10 Essential (primary) hypertension: Secondary | ICD-10-CM | POA: Diagnosis not present

## 2017-02-10 MED ORDER — SIMVASTATIN 40 MG PO TABS: 40.0000 mg | ORAL_TABLET | Freq: Every evening | ORAL | 1 refills | Status: DC

## 2017-02-10 MED ORDER — INDAPAMIDE 2.5 MG PO TABS: 2.5000 mg | ORAL_TABLET | Freq: Every morning | ORAL | 1 refills | Status: DC

## 2017-02-10 MED ORDER — POTASSIUM CHLORIDE 20 MEQ/15ML (10%) PO SOLN: ORAL | 1 refills | Status: DC

## 2017-02-10 NOTE — Patient Instructions (Addendum)
  Change how you are taking your potassium supplement.  Try and take the larger amount on Monday, Wednesday and Friday (rather than 3 days in a row), and then smaller amount on the other 4 days/week. By alternating, hopefully your potassium level will stay within the normal range. It has been okay in the past, likely related to the day of the week we checked it.   STOP BUYING CIGARETTES! You have done a great job in cutting back, and not smoking on a daily.  I need you to stop completely, and not having them around will make it easier for you.  It is recommended that you have another bone density test.  We discussed this at your physical, and the order for it was entered at that time. It is fine for you to schedule it for the same day as your mammogram.  I recommend getting the new shingles vaccine (Shingrix). You will need to check with your insurance to see if it is covered, and if covered by Medicare Part D, you need to get from the pharmacy rather than our office.  It is a series of 2 injections, spaced 2 months apart. Please contact us with the dates you received the injections from the pharmacy so we can put it in your chart.

## 2017-03-10 ENCOUNTER — Other Ambulatory Visit: Payer: Self-pay | Admitting: Family Medicine

## 2017-03-10 DIAGNOSIS — M109 Gout, unspecified: Secondary | ICD-10-CM

## 2017-03-31 ENCOUNTER — Other Ambulatory Visit: Payer: Self-pay | Admitting: Family Medicine

## 2017-03-31 DIAGNOSIS — E876 Hypokalemia: Secondary | ICD-10-CM

## 2017-04-20 ENCOUNTER — Other Ambulatory Visit: Payer: Self-pay | Admitting: Family Medicine

## 2017-04-20 DIAGNOSIS — Z1231 Encounter for screening mammogram for malignant neoplasm of breast: Secondary | ICD-10-CM

## 2017-05-05 ENCOUNTER — Other Ambulatory Visit: Payer: Self-pay | Admitting: Family Medicine

## 2017-05-05 DIAGNOSIS — I10 Essential (primary) hypertension: Secondary | ICD-10-CM

## 2017-05-11 DIAGNOSIS — H401131 Primary open-angle glaucoma, bilateral, mild stage: Secondary | ICD-10-CM | POA: Diagnosis not present

## 2017-06-01 ENCOUNTER — Ambulatory Visit
Admission: RE | Admit: 2017-06-01 | Discharge: 2017-06-01 | Disposition: A | Discharge status: Home / Self Care | Payer: Medicare Other | Source: Ambulatory Visit | Attending: Family Medicine | Admitting: Family Medicine

## 2017-06-01 DIAGNOSIS — Z1231 Encounter for screening mammogram for malignant neoplasm of breast: Secondary | ICD-10-CM

## 2017-06-01 DIAGNOSIS — M85852 Other specified disorders of bone density and structure, left thigh: Secondary | ICD-10-CM | POA: Diagnosis not present

## 2017-06-01 DIAGNOSIS — M8580 Other specified disorders of bone density and structure, unspecified site: Secondary | ICD-10-CM

## 2017-06-01 DIAGNOSIS — Z780 Asymptomatic menopausal state: Secondary | ICD-10-CM

## 2017-08-24 ENCOUNTER — Other Ambulatory Visit: Payer: Self-pay | Admitting: Family Medicine

## 2017-08-24 DIAGNOSIS — E7800 Pure hypercholesterolemia, unspecified: Secondary | ICD-10-CM

## 2017-08-25 ENCOUNTER — Other Ambulatory Visit: Payer: Self-pay | Admitting: Family Medicine

## 2017-08-25 DIAGNOSIS — M109 Gout, unspecified: Secondary | ICD-10-CM

## 2017-09-30 ENCOUNTER — Other Ambulatory Visit: Payer: Medicare Other

## 2017-09-30 DIAGNOSIS — I10 Essential (primary) hypertension: Secondary | ICD-10-CM

## 2017-09-30 DIAGNOSIS — E876 Hypokalemia: Secondary | ICD-10-CM | POA: Diagnosis not present

## 2017-09-30 DIAGNOSIS — Z5181 Encounter for therapeutic drug level monitoring: Secondary | ICD-10-CM | POA: Diagnosis not present

## 2017-09-30 LAB — CBC WITH DIFFERENTIAL/PLATELET
BASOS PCT: 0.5 %
Basophils Absolute: 29 cells/uL (ref 0–200)
EOS PCT: 0 %
Eosinophils Absolute: 0 cells/uL — ABNORMAL LOW (ref 15–500)
HCT: 36.7 % (ref 35.0–45.0)
HEMOGLOBIN: 12.5 g/dL (ref 11.7–15.5)
LYMPHS ABS: 1520 {cells}/uL (ref 850–3900)
MCH: 32.2 pg (ref 27.0–33.0)
MCHC: 34.1 g/dL (ref 32.0–36.0)
MCV: 94.6 fL (ref 80.0–100.0)
MONOS PCT: 8 %
MPV: 10.3 fL (ref 7.5–12.5)
NEUTROS PCT: 65.3 %
Neutro Abs: 3787 cells/uL (ref 1500–7800)
PLATELETS: 242 10*3/uL (ref 140–400)
RBC: 3.88 10*6/uL (ref 3.80–5.10)
RDW: 12.1 % (ref 11.0–15.0)
TOTAL LYMPHOCYTE: 26.2 %
WBC mixed population: 464 cells/uL (ref 200–950)
WBC: 5.8 10*3/uL (ref 3.8–10.8)

## 2017-09-30 LAB — COMPREHENSIVE METABOLIC PANEL
AG Ratio: 1.3 (calc) (ref 1.0–2.5)
ALBUMIN MSPROF: 4.1 g/dL (ref 3.6–5.1)
ALT: 14 U/L (ref 6–29)
AST: 19 U/L (ref 10–35)
Alkaline phosphatase (APISO): 79 U/L (ref 33–130)
BUN: 11 mg/dL (ref 7–25)
CO2: 29 mmol/L (ref 20–32)
Calcium: 9.9 mg/dL (ref 8.6–10.4)
Chloride: 100 mmol/L (ref 98–110)
Creat: 0.66 mg/dL (ref 0.50–0.99)
Globulin: 3.2 g/dL (calc) (ref 1.9–3.7)
Glucose, Bld: 90 mg/dL (ref 65–99)
POTASSIUM: 3.5 mmol/L (ref 3.5–5.3)
SODIUM: 138 mmol/L (ref 135–146)
TOTAL PROTEIN: 7.3 g/dL (ref 6.1–8.1)
Total Bilirubin: 0.3 mg/dL (ref 0.2–1.2)

## 2017-10-03 NOTE — Progress Notes (Signed)
Chief Complaint  Patient presents with  . Medicare Wellness    nonfasting AWV, labs already done with pelvic (pap 2016). No concerns.  . Flu Vaccine    declined.     Bailey Parker is a 67 y.o. female who presents for annual wellness visit and follow-up on chronic medical conditions.  She has the following concerns:  Hypertension follow-up: Blood pressures hasn't been checked elsewhere in the last few months. Denies dizziness, headaches, chest pain, edema, muscle cramps. Denies side effects of medications.   H/o hypokalemia--related to diuretic use.  She is using 40 mEq on Mon/Wed/Fri, 20 mEq the other days (rather than taking higher dose 3 days in a row, as she was on last check).  She denies muscle cramps.  Hyperlipidemia follow-up: Patient is reportedly following a low-fat, low cholesterol diet. Compliant with medications and denies medication side effects. Last lipids were at goal: Lab Results  Component Value Date   CHOL 117 02/08/2017   HDL 42 (L) 02/08/2017   LDLCALC 56 02/08/2017   TRIG 94 02/08/2017   CHOLHDL 2.8 02/08/2017    Gout: Denies any flares. Compliant with allopurinol, still doing well on the lower dose. Last uric acid level (on the lower dose) was fine: Lab Results  Component Value Date   LABURIC 4.3 02/08/2017    Smoking:  Admits to still smoking some.  On a "bad day" she smokes 4-6, some days none at all, more commonly about 3/day. Her husband retired, and she doesn't smoke when he is home, only in the garage.  This hasn't changed.  Her friends don't know she smokes.  She still buys cigarettes.    H/o pre-diabetes:  She continues to watch her diet (limit sweets, portions).Last check of A1c was normal last year.  Lab Results  Component Value Date   HGBA1C 5.6 08/05/2016    Immunization History  Administered Date(s) Administered  . Pneumococcal Conjugate-13 01/22/2014  . Pneumococcal Polysaccharide-23 06/19/2015  . Td 08/03/1995, 09/30/2016  .  Tdap 02/02/2006   refuses flu shots Last Pap smear: 05/2015--normal; no high risk HPV detected Last mammogram: 05/2017 Last colonoscopy: 03/2016; polyps. Due again 5 years Last DEXA: 05/2017 T-1.1 at left fem neck (stable from prior DEXA elsewhere)  Dentist: 2x/year, cleanings 4x/year Ophtho: twice yearly  Exercise: nothing regular   Other doctors caring for patient include: Ophtho: [place] [facility] ophtho Dentist: Dr. Radford Pax GI: Dr. Earlean Shawl Podiatrist ([place])  Depression, fall and ADL screens are negative. See epic questionnaires.  End of Life Discussion: Patient hasa living will and medical power of attorney  Past Medical History:  Diagnosis Date  . COPD (chronic obstructive pulmonary disease) ([place])   . Dietary surveillance and counseling   . Diverticulosis 2012  . Glaucoma    Dr. Ellie Lunch  . Gouty arthropathy    R foot  . Hx of adenomatous colonic polyps 2012   Dr. Earlean Shawl  . Hyperlipidemia 1997  . Hypertension    controlled  . Internal hemorrhoids 2012  . LBP (low back pain)   . Osteopenia 04/2010   (DEXA ordered by Dr. Ubaldo Glassing, scheduled for 04/2012)  . Postmenopausal   . Prediabetes   . Smoker   . Vitamin D deficiency     Past Surgical History:  Procedure Laterality Date  . APPENDECTOMY  1966  . BAND HEMORRHOIDECTOMY  06/2011   Dr. Earlean Shawl  . COLONOSCOPY  03/2011   Dr. Earlean Shawl  . [place]    Social History  Socioeconomic History  . Marital status: Married    Spouse name: Not on file  . Number of children: 2  . Years of education: Not on file  . Highest education level: Not on file  Social Needs  . Financial resource strain: Not on file  . Food insecurity - worry: Not on file  . Food insecurity - inability: Not on file  . Transportation needs - medical: Not on file  . Transportation needs - non-medical: Not on file  Occupational History  . Occupation: retired Transport planner)  Tobacco Use  . Smoking status: Current Some Day  Smoker    Packs/day: 0.30    Years: 40.00    Pack years: 12.00    Types: Cigarettes  . Smokeless tobacco: Never Used  . Tobacco comment: smoking 0-5/day, often just 3, but not every day  Substance and Sexual Activity  . Alcohol use: Yes    Alcohol/week: 0.0 oz    Comment: 2 drinks per week.  . Drug use: No  . Sexual activity: Yes    Partners: Male  Other Topics Concern  . Not on file  Social History Narrative   Lives with husband.  1 daughter in [place], the other daughter lives down the street. 1 stepdaughter (who has 4 children), all local. 2 stepgrandchildren (from her local daughter). 1 great grandson (stepgrandchild's son)    Family History  Problem Relation Age of Onset  . Alzheimer's disease Mother   . Hypertension Mother   . Stroke Mother 74  . Heart disease Father   . Diabetes Father   . Hypertension Brother   . Diabetes Brother   . Cancer Neg Hx   . Breast cancer Neg Hx     Outpatient Encounter Medications as of 10/04/2017  Medication Sig  . allopurinol (ZYLOPRIM) 100 MG tablet TAKE 1 TABLET BY MOUTH  DAILY  . aspirin 81 MG tablet Take 1 tablet (81 mg total) by mouth daily.  . Calcium Carbonate-Vitamin D (CALCIUM 600+D) 600-400 MG-UNIT per tablet Take 1 tablet by mouth daily.  . cholecalciferol (VITAMIN D) 1000 UNITS tablet Take 1,000 Units by mouth daily.  . dorzolamide-timolol (COSOPT) 22.3-6.8 MG/ML ophthalmic solution Place 1 drop into both eyes 2 (two) times daily.  . indapamide (LOZOL) 2.5 MG tablet TAKE 1 TABLET BY MOUTH  EVERY MORNING  . potassium chloride 20 MEQ/15ML (10%) SOLN TAKE 30ML 3 TIMES WEEKLY ON MONDAY, WEDNESDAY, AND  FRIDAY AND 15ML 4 TIMES A  WEEK ON ALL OTHER DAYS  . simvastatin (ZOCOR) 40 MG tablet TAKE 1 TABLET BY MOUTH  EVERY EVENING  . [DISCONTINUED] indapamide (LOZOL) 2.5 MG tablet TAKE 1 TABLET BY MOUTH  EVERY MORNING   No facility-administered encounter medications on file as of 10/04/2017.     No Known Allergies   ROS: The patient  denies anorexia, fever, weight changes, headaches, vision changes, decreased hearing, ear pain, sore throat, breast concerns, chest pain, palpitations, dizziness, syncope, dyspnea on exertion, cough, swelling, nausea, vomiting, diarrhea, constipation, abdominal pain, melena, hematochezia, indigestion/heartburn, hematuria, incontinence, dysuria, vaginal bleeding, discharge, odor or itch, genital lesions, joint pains, numbness, tingling, weakness, tremor, suspicious skin lesions, depression, anxiety, abnormal bleeding/bruising, or enlarged lymph nodes.   PHYSICAL EXAM:  BP 136/80   Pulse 76   Ht 5' (1.524 m)   Wt 132 lb (59.9 kg)   BMI 25.78 kg/m   Wt Readings from Last 3 Encounters:  10/04/17 132 lb (59.9 kg)  02/10/17 130 lb 12.8 oz (59.3 kg)  08/12/16  130 lb 3.2 oz (59.1 kg)    General Appearance:  Alert, cooperative, no distress, appears stated age   Head:  Normocephalic, without obvious abnormality, atraumatic   Eyes:  PERRL, conjunctiva/corneas clear, EOM's intact, fundi benign   Ears:  Normal TM's and external ear canals   Nose:  Nares normal, mucosa is normal, no drainage or sinus tenderness   Throat:  Lips, mucosa, and tongue normal; teeth and gums normal. Torus pallatini superiorly  Neck:  Supple, no lymphadenopathy; thyroid: no enlargement/tenderness/nodules; no carotid bruit or JVD. Slightly prominent submandibular glands noted, symmetric and unchanged from prior exams  Back:  Spine nontender, no curvature, ROM normal, no CVA tenderness.   Lungs:  Clear to auscultation bilaterally without wheezes, rales or ronchi; respirations unlabored   Chest Wall:  No tenderness or deformity   Heart:  Regular rate and rhythm, S1 and S2 normal, no murmur, rub or gallop   Breast Exam:  No tenderness, masses, or nipple discharge or inversion. No axillary lymphadenopathy. Large, pendulous breasts   Abdomen:  Soft, non-tender, nondistended, normoactive bowel sounds, no  masses, no hepatosplenomegaly. Diastasis recti and prominent xiphoid process noted (nontender)  Genitalia:  Normal external genitalia without lesions. BUS and vagina normal; no cervical motion tenderness. No abnormal vaginal discharge. Uterus and adnexa not enlarged, nontender, no masses. Pap not performed   Rectal:  Normal tone, no masses or tenderness; guaiac negative stool   Extremities:  No clubbing, cyanosis or edema   Pulses:  2+ and symmetric all extremities   Skin:  Skin color, texture, turgor normal, no rashes or lesions   Lymph nodes:  Cervical, supraclavicular, and axillary nodes normal   Neurologic:  CNII-XII intact, normal strength, sensation and gait; reflexes 2+ and symmetric throughout   Psych:  Normal mood, affect, hygiene and grooming   Lab Results  Component Value Date   WBC 5.8 09/30/2017   HGB 12.5 09/30/2017   HCT 36.7 09/30/2017   MCV 94.6 09/30/2017   PLT 242 09/30/2017     Chemistry      Component Value Date/Time   NA 138 09/30/2017 0916   K 3.5 09/30/2017 0916   CL 100 09/30/2017 0916   CO2 29 09/30/2017 0916   BUN 11 09/30/2017 0916   CREATININE 0.66 09/30/2017 0916      Component Value Date/Time   CALCIUM 9.9 09/30/2017 0916   ALKPHOS 75 02/08/2017 0829   AST 19 09/30/2017 0916   ALT 14 09/30/2017 0916   BILITOT 0.3 09/30/2017 0916     Fasting glucose 90   ASSESSMENT/PLAN:  Medicare annual wellness visit, subsequent  Essential hypertension, benign - borderline here; encouraged to check BP elsewhere; goals reviewed - Plan: Comprehensive metabolic panel, indapamide (LOZOL) 2.5 MG tablet  Pure hypercholesterolemia - Plan: Lipid panel  Hypokalemia - adequately replaced (borderline), continue supplements - Plan: potassium chloride 20 MEQ/15ML (10%) SOLN  Osteopenia, unspecified location - very mild and stable; continue Ca, D, encouraged weight-bearing exercise and smoking cessation - Plan: TSH  Gouty  arthropathy - continue allopurinol - Plan: Uric acid  Tobacco use disorder - counseled re: complete cessation, risks; encouraged to stop buying cigarettes, alternative behaviors reviewed  Medication monitoring encounter - Plan: Lipid panel, Comprehensive metabolic panel, Uric acid  Fatigue, unspecified type - Plan: TSH, Comprehensive metabolic panel  Essential hypertension, benign - Plan: Comprehensive metabolic panel, indapamide (LOZOL) 2.5 MG tablet  Hypokalemia - Plan: potassium chloride 20 MEQ/15ML (10%) SOLN    Counseled again re: smoking cessation. (don't  buy cigarettes; exercise instead of smoking; keep a jar to save the money for herself that she would spent on cigarettes, etc).   Discussed monthly self breast exams and yearly mammograms; at least 30 minutes of aerobic activity at least 5 days/week and weight-bearing exercise 2x/week; proper sunscreen use reviewed; healthy diet, including goals of calcium and vitamin D intake and alcohol recommendations (less than or equal to 1 drink/day) reviewed; regular seatbelt use; changing batteries in smoke detectors.  Immunization recommendations discussed. High dose flu shots are recommended yearly--she declines. Recommended Shingrix, side effects reviewed--needs to get from pharmacy.  Colonoscopy recommendations reviewed, UTD, due again 03/2021  MOST form reviewed, Full Code, Full Care  F/u 6 months with labs prior Uric acid, lipids, c-met, TSH    Medicare Attestation I have personally reviewed: The patient's medical and social history Their use of alcohol, tobacco or illicit drugs Their current medications and supplements The patient's functional ability including ADLs,fall risks, home safety risks, cognitive, and hearing and visual impairment Diet and physical activities Evidence for depression or mood disorders  The patient's weight, height and BMI have been recorded in the chart.  I have made referrals, counseling, and provided  education to the patient based on review of the above and I have provided the patient with a written personalized care plan for preventive services.

## 2017-10-04 ENCOUNTER — Encounter: Payer: Self-pay | Admitting: Family Medicine

## 2017-10-04 ENCOUNTER — Ambulatory Visit (INDEPENDENT_AMBULATORY_CARE_PROVIDER_SITE_OTHER): Payer: Medicare Other | Admitting: Family Medicine

## 2017-10-04 VITALS — BP 136/80 | HR 76 | Ht 60.0 in | Wt 132.0 lb

## 2017-10-04 DIAGNOSIS — E7800 Pure hypercholesterolemia, unspecified: Secondary | ICD-10-CM | POA: Diagnosis not present

## 2017-10-04 DIAGNOSIS — M109 Gout, unspecified: Secondary | ICD-10-CM

## 2017-10-04 DIAGNOSIS — I10 Essential (primary) hypertension: Secondary | ICD-10-CM | POA: Diagnosis not present

## 2017-10-04 DIAGNOSIS — Z5181 Encounter for therapeutic drug level monitoring: Secondary | ICD-10-CM | POA: Diagnosis not present

## 2017-10-04 DIAGNOSIS — Z0000 Encounter for general adult medical examination without abnormal findings: Secondary | ICD-10-CM | POA: Diagnosis not present

## 2017-10-04 DIAGNOSIS — E876 Hypokalemia: Secondary | ICD-10-CM

## 2017-10-04 DIAGNOSIS — F17200 Nicotine dependence, unspecified, uncomplicated: Secondary | ICD-10-CM | POA: Diagnosis not present

## 2017-10-04 DIAGNOSIS — R5383 Other fatigue: Secondary | ICD-10-CM | POA: Diagnosis not present

## 2017-10-04 DIAGNOSIS — M8580 Other specified disorders of bone density and structure, unspecified site: Secondary | ICD-10-CM

## 2017-10-04 MED ORDER — INDAPAMIDE 2.5 MG PO TABS: 2.5000 mg | ORAL_TABLET | Freq: Every morning | ORAL | 1 refills | Status: DC

## 2017-10-04 MED ORDER — POTASSIUM CHLORIDE 20 MEQ/15ML (10%) PO SOLN: ORAL | 1 refills | Status: DC

## 2017-10-04 NOTE — Patient Instructions (Addendum)
  HEALTH MAINTENANCE RECOMMENDATIONS:  It is recommended that you get at least 30 minutes of aerobic exercise at least 5 days/week (for weight loss, you may need as much as 60-90 minutes). This can be any activity that gets your heart rate up. This can be divided in 10-15 minute intervals if needed, but try and build up your endurance at least once a week.  Weight bearing exercise is also recommended twice weekly.  Eat a healthy diet with lots of vegetables, fruits and fiber.  "Colorful" foods have a lot of vitamins (ie green vegetables, tomatoes, red peppers, etc).  Limit sweet tea, regular sodas and alcoholic beverages, all of which has a lot of calories and sugar.  Up to 1 alcoholic drink daily may be beneficial for women (unless trying to lose weight, watch sugars).  Drink a lot of water.  Calcium recommendations are 1200-1500 mg daily (1500 mg for postmenopausal women or women without ovaries), and vitamin D 1000 IU daily.  This should be obtained from diet and/or supplements (vitamins), and calcium should not be taken all at once, but in divided doses.  Monthly self breast exams and yearly mammograms for women over the age of 31 is recommended.  Sunscreen of at least SPF 30 should be used on all sun-exposed parts of the skin when outside between the hours of 10 am and 4 pm (not just when at beach or pool, but even with exercise, golf, tennis, and yard work!)  Use a sunscreen that says "broad spectrum" so it covers both UVA and UVB rays, and make sure to reapply every 1-2 hours.  Remember to change the batteries in your smoke detectors when changing your clock times in the spring and fall.  Use your seat belt every time you are in a car, and please drive safely and not be distracted with cell phones and texting while driving.   Ms. Bailey , Thank you for taking time to come for your Medicare Wellness Visit. I appreciate your ongoing commitment to your health goals. Please review the following  plan we discussed and let me know if I can assist you in the future.   These are the goals we discussed: Goals    None      This is a list of the screening recommended for you and due dates:  Health Maintenance  Topic Date Due  . Flu Shot  12/19/2017*  . Mammogram  06/02/2019  . Colon Cancer Screening  04/13/2021  . Tetanus Vaccine  09/30/2026  . DEXA scan (bone density measurement)  Completed  .  Hepatitis C: One time screening is recommended by Center for Disease Control  (CDC) for  adults born from [birth year] through 1967.   Completed  . Pneumonia vaccines  Completed  *Topic was postponed. The date shown is not the original due date.   We recommend yearly flu shots. Your next mammogram is due 05/2018 (not 2020 as stated above).   I recommend getting the new shingles vaccine (Shingrix). You will need to check with your insurance to see if it is covered, and if covered by Medicare Part D, you need to get from the pharmacy rather than our office.  It is a series of 2 injections, spaced 2 months apart.

## 2017-11-30 DIAGNOSIS — H2513 Age-related nuclear cataract, bilateral: Secondary | ICD-10-CM | POA: Diagnosis not present

## 2017-11-30 DIAGNOSIS — H5213 Myopia, bilateral: Secondary | ICD-10-CM | POA: Diagnosis not present

## 2017-11-30 DIAGNOSIS — H401131 Primary open-angle glaucoma, bilateral, mild stage: Secondary | ICD-10-CM | POA: Diagnosis not present

## 2017-12-01 ENCOUNTER — Other Ambulatory Visit: Payer: Self-pay | Admitting: Family Medicine

## 2017-12-01 DIAGNOSIS — M109 Gout, unspecified: Secondary | ICD-10-CM

## 2017-12-01 DIAGNOSIS — E7800 Pure hypercholesterolemia, unspecified: Secondary | ICD-10-CM

## 2017-12-09 ENCOUNTER — Other Ambulatory Visit: Payer: Self-pay | Admitting: Family Medicine

## 2017-12-09 DIAGNOSIS — I10 Essential (primary) hypertension: Secondary | ICD-10-CM

## 2018-03-15 ENCOUNTER — Other Ambulatory Visit: Payer: Self-pay | Admitting: Family Medicine

## 2018-03-15 DIAGNOSIS — E7800 Pure hypercholesterolemia, unspecified: Secondary | ICD-10-CM

## 2018-03-15 DIAGNOSIS — M109 Gout, unspecified: Secondary | ICD-10-CM

## 2018-04-11 ENCOUNTER — Other Ambulatory Visit: Payer: Medicare Other

## 2018-04-11 DIAGNOSIS — R5383 Other fatigue: Secondary | ICD-10-CM

## 2018-04-11 DIAGNOSIS — M109 Gout, unspecified: Secondary | ICD-10-CM

## 2018-04-11 DIAGNOSIS — M8580 Other specified disorders of bone density and structure, unspecified site: Secondary | ICD-10-CM | POA: Diagnosis not present

## 2018-04-11 DIAGNOSIS — I10 Essential (primary) hypertension: Secondary | ICD-10-CM | POA: Diagnosis not present

## 2018-04-11 DIAGNOSIS — E7800 Pure hypercholesterolemia, unspecified: Secondary | ICD-10-CM | POA: Diagnosis not present

## 2018-04-11 DIAGNOSIS — Z5181 Encounter for therapeutic drug level monitoring: Secondary | ICD-10-CM

## 2018-04-12 LAB — LIPID PANEL
CHOL/HDL RATIO: 2.7 ratio (ref 0.0–4.4)
Cholesterol, Total: 109 mg/dL (ref 100–199)
HDL: 41 mg/dL (ref >39)
LDL CALC: 53 mg/dL (ref 0–99)
TRIGLYCERIDES: 76 mg/dL (ref 0–149)
VLDL CHOLESTEROL CAL: 15 mg/dL (ref 5–40)

## 2018-04-12 LAB — COMPREHENSIVE METABOLIC PANEL
ALBUMIN: 4.2 g/dL (ref 3.6–4.8)
ALT: 14 IU/L (ref 0–32)
AST: 19 IU/L (ref 0–40)
Albumin/Globulin Ratio: 1.6 (ref 1.2–2.2)
Alkaline Phosphatase: 75 IU/L (ref 39–117)
BUN / CREAT RATIO: 12 (ref 12–28)
BUN: 9 mg/dL (ref 8–27)
Bilirubin Total: 0.2 mg/dL (ref 0.0–1.2)
CALCIUM: 9.5 mg/dL (ref 8.7–10.3)
CO2: 25 mmol/L (ref 20–29)
CREATININE: 0.74 mg/dL (ref 0.57–1.00)
Chloride: 100 mmol/L (ref 96–106)
GFR calc Af Amer: 96 mL/min/{1.73_m2} (ref >59)
GFR, EST NON AFRICAN AMERICAN: 84 mL/min/{1.73_m2} (ref >59)
GLOBULIN, TOTAL: 2.6 g/dL (ref 1.5–4.5)
Glucose: 88 mg/dL (ref 65–99)
POTASSIUM: 3.5 mmol/L (ref 3.5–5.2)
SODIUM: 140 mmol/L (ref 134–144)
Total Protein: 6.8 g/dL (ref 6.0–8.5)

## 2018-04-12 LAB — URIC ACID: Uric Acid: 4.1 mg/dL (ref 2.5–7.1)

## 2018-04-12 LAB — TSH: TSH: 1.48 u[IU]/mL (ref 0.450–4.500)

## 2018-04-12 NOTE — Progress Notes (Signed)
Chief Complaint  Patient presents with  . Hypertension    nonfasting med check. No concerns.    Hypertension follow-up: Blood pressures are checked sporadically if she is out, not regularly, and can't recall the results. Denies dizziness, headaches, chest pain, edema, muscle cramps. Denies side effects of medications. Compliant with medications.  H/o hypokalemia--related to diuretic use. She is using 40 mEq on Mon/Wed/Fri, 20 mEq the other days. She denies muscle cramps. Reports compliance with the liquid potassium supplement.  Hyperlipidemia follow-up: Patient is reportedly following a low-fat, low cholesterol diet. Compliant with medications and denies medication side effects. Last lipids were at goal. She had labs done prior to visit today (see below)  Gout: Denies any flares. Compliant with low dose allopurinol. Labs done prior to visit today.  Smoking:Admits to still smoking some. Smoking 3-6 cigarettes/day. Her husband retired, and she doesn't smoke when he is home, only smokes in the garage.  This hasn't changed.  Her friends don't know she smokes.  She still buys cigarettes.    H/o pre-diabetes: She continues to watch her diet (limit sweets, portions).Last check of A1c was normal.  Last abnormal was in 2016, highest was A1c 5.8. Fasting sugars have been normal. Lab Results  Component Value Date   HGBA1C 5.6 08/05/2016   Husband Milbert Coulter was recently diagnosed with Parkinson's disease.  Very mild tremor, no other noticeable symptoms yet.  PMH, PSH, SH reviewed/updated  Outpatient Encounter Medications as of 04/13/2018  Medication Sig  . allopurinol (ZYLOPRIM) 100 MG tablet TAKE 1 TABLET BY MOUTH  DAILY  . aspirin 81 MG tablet Take 1 tablet (81 mg total) by mouth daily.  . Calcium Carbonate-Vitamin D (CALCIUM 600+D) 600-400 MG-UNIT per tablet Take 1 tablet by mouth daily.  . cholecalciferol (VITAMIN D) 1000 UNITS tablet Take 1,000 Units by mouth daily.  .  dorzolamide-timolol (COSOPT) 22.3-6.8 MG/ML ophthalmic solution Place 1 drop into both eyes 2 (two) times daily.  . indapamide (LOZOL) 2.5 MG tablet TAKE 1 TABLET BY MOUTH  EVERY MORNING  . potassium chloride 20 MEQ/15ML (10%) SOLN TAKE 30ML 3 TIMES WEEKLY ON MONDAY, WEDNESDAY, AND  FRIDAY AND 15ML 4 TIMES A  WEEK ON ALL OTHER DAYS  . simvastatin (ZOCOR) 40 MG tablet TAKE 1 TABLET BY MOUTH  EVERY EVENING   No facility-administered encounter medications on file as of 04/13/2018.    No Known Allergies  ROS:  Denies fever, chills, URI or allergy complaints, nausea, vomiting, diarrhea, constipation, blood in stools, urinary complaints. Bleeding, bruising, rashes, chest pain, palpitations, shortness of breath, mood changes.   PHYSICAL EXAM:  BP 130/78   Pulse 80   Ht 5' (1.524 m)   Wt 132 lb (59.9 kg)   BMI 25.78 kg/m    Wt Readings from Last 3 Encounters:  04/13/18 132 lb (59.9 kg)  10/04/17 132 lb (59.9 kg)  02/10/17 130 lb 12.8 oz (59.3 kg)    Well appearing, pleasant female in good spirits HEENT: PERRL, EOMI, conjunctiva clear Neck: no lymphadenopathy, thyromegaly or carotid bruit Heart: regular rate and rhythm Lungs: clear bilaterally Back: no spinal or CVA tenderness Abdomen: soft, nontender, no organomegaly or mass Extremities: no edema, normal pulses.  Psych: normal mood, affect, hygiene and grooming Neuro: alert and oriented, cranial nerves intact, normal gait   Lab Results  Component Value Date   CHOL 109 04/11/2018   HDL 41 04/11/2018   LDLCALC 53 04/11/2018   TRIG 76 04/11/2018   CHOLHDL 2.7 04/11/2018  Chemistry      Component Value Date/Time   NA 140 04/11/2018 0921   K 3.5 04/11/2018 0921   CL 100 04/11/2018 0921   CO2 25 04/11/2018 0921   BUN 9 04/11/2018 0921   CREATININE 0.74 04/11/2018 0921   CREATININE 0.66 09/30/2017 0916      Component Value Date/Time   CALCIUM 9.5 04/11/2018 0921   ALKPHOS 75 04/11/2018 0921   AST 19 04/11/2018 0921    ALT 14 04/11/2018 0921   BILITOT 0.2 04/11/2018 0921     Fasting glucose 88  Lab Results  Component Value Date   TSH 1.480 04/11/2018   Lab Results  Component Value Date   LABURIC 4.1 04/11/2018    ASSESSMENT/PLAN:  Essential hypertension, benign - well controlled  Pure hypercholesterolemia - at goal on simvastatin  Hypokalemia - at lower limit of normal; continue potassium supplements  Gouty arthropathy - doing well, no flares. Continue allopurinol  Tobacco use disorder - counseled again in detail. Encouraged to stop buying cigarettes, identify triggers and other techniques. Risks reviewed   AWV 6 mos--nonfasting, c-met at visit.

## 2018-04-13 ENCOUNTER — Ambulatory Visit (INDEPENDENT_AMBULATORY_CARE_PROVIDER_SITE_OTHER): Payer: Medicare Other | Admitting: Family Medicine

## 2018-04-13 ENCOUNTER — Encounter: Payer: Self-pay | Admitting: Family Medicine

## 2018-04-13 VITALS — BP 130/78 | HR 80 | Ht 60.0 in | Wt 132.0 lb

## 2018-04-13 DIAGNOSIS — F17200 Nicotine dependence, unspecified, uncomplicated: Secondary | ICD-10-CM

## 2018-04-13 DIAGNOSIS — E876 Hypokalemia: Secondary | ICD-10-CM | POA: Diagnosis not present

## 2018-04-13 DIAGNOSIS — E7800 Pure hypercholesterolemia, unspecified: Secondary | ICD-10-CM

## 2018-04-13 DIAGNOSIS — M109 Gout, unspecified: Secondary | ICD-10-CM | POA: Diagnosis not present

## 2018-04-13 DIAGNOSIS — I10 Essential (primary) hypertension: Secondary | ICD-10-CM | POA: Diagnosis not present

## 2018-04-13 NOTE — Patient Instructions (Addendum)
Continue all of your current medications. No refills are needed today, so call your pharmacy when you need them and we will take care of it.  Please stop buying cigarettes!  This way you really can work on cutting back, since they won't be as accessible. Please try and quit smoking--start thinking about why/when you smoke (habit, boredom, stress) in order to come up with effective strategies to cut back or quit. Available resources to help you quit include free counseling through [facility] ([facility] or 1-800-QUITNOW), smoking cessation classes through [facility] (call to find out schedule), over-the-counter nicotine replacements, and e-cigarettes (although this may not help break the hand-mouth habit).  Many insurance companies also have smoking cessation programs (which may decrease the cost of patches, meds if enrolled).  If these methods are not effective for you, and you are motivated to quit, return to discuss the possibility of prescription medications.  Please try and get 30 minutes of aerobic exercise each day (can be 10-15 minutes at a time).  Start walking with your husband!

## 2018-04-22 ENCOUNTER — Other Ambulatory Visit: Payer: Self-pay | Admitting: Family Medicine

## 2018-04-22 DIAGNOSIS — Z1231 Encounter for screening mammogram for malignant neoplasm of breast: Secondary | ICD-10-CM

## 2018-06-02 ENCOUNTER — Ambulatory Visit
Admission: RE | Admit: 2018-06-02 | Discharge: 2018-06-02 | Disposition: A | Discharge status: Home / Self Care | Payer: Medicare Other | Source: Ambulatory Visit | Attending: Family Medicine | Admitting: Family Medicine

## 2018-06-02 DIAGNOSIS — Z1231 Encounter for screening mammogram for malignant neoplasm of breast: Secondary | ICD-10-CM | POA: Diagnosis not present

## 2018-06-14 DIAGNOSIS — H401131 Primary open-angle glaucoma, bilateral, mild stage: Secondary | ICD-10-CM | POA: Diagnosis not present

## 2018-06-16 ENCOUNTER — Other Ambulatory Visit: Payer: Self-pay | Admitting: Family Medicine

## 2018-06-16 DIAGNOSIS — M109 Gout, unspecified: Secondary | ICD-10-CM

## 2018-06-16 DIAGNOSIS — E7800 Pure hypercholesterolemia, unspecified: Secondary | ICD-10-CM

## 2018-11-01 NOTE — Progress Notes (Signed)
Chief Complaint  Patient presents with  . Medicare Wellness    nonfasting AWV with pelvic. No concerns today.   . Flu Vaccine    declined.     Bailey Parker is a 68 y.o. female who presents for annual wellness visit and follow-up on chronic medical conditions.   Hypertension follow-up: Blood pressures are checked periodically at the pharmacy, recalls they are okay, not the exact values (written down at home).  Max 469 systolic that she can recall.  Denies dizziness, headaches, chest pain, edema, muscle cramps. Denies side effects of medications. Compliant with medications.  H/o hypokalemia--related to diuretic use. She is using 40 mEqon Mon/Wed/Fri, 20 mEq the other days. She denies muscle cramps. Reports compliance with the liquid potassium supplement. Lab Results  Component Value Date   K 3.5 04/11/2018   Hyperlipidemia follow-up: Patient is reportedly following a low-fat, low cholesterol diet. Compliant with medications and denies medication side effects. Last lipids were at goal.  Lab Results  Component Value Date   CHOL 109 04/11/2018   HDL 41 04/11/2018   LDLCALC 53 04/11/2018   TRIG 76 04/11/2018   CHOLHDL 2.7 04/11/2018   Gout: Denies any flares. Compliant with low dose allopurinol. Denies any flares. Lab Results  Component Value Date   LABURIC 4.1 04/11/2018   Smoking:Admits to still smoking some. Smoking 0-6 cigarettes/day. Her husband retired, and she doesn't smoke when he is home, only smokes in the garage.This hasn't changed. Her friends don't know she smokes. She still buyscigarettes. No changes since last visit.  H/o pre-diabetes: She continues to watch her diet (limit sweets, portions).Last check of A1c was normal.  Last abnormal was in 2016, highest was A1c 5.8. Fasting sugars have been normal. Lab Results  Component Value Date   HGBA1C 5.6 08/05/2016    Immunization History  Administered Date(s) Administered  . Pneumococcal Conjugate-13  01/22/2014  . Pneumococcal Polysaccharide-23 06/19/2015  . Td 08/03/1995, 09/30/2016  . Tdap 02/02/2006   refuses flu shots Last Pap smear: 05/2015--normal; no high risk HPV detected Last mammogram: 05/2018 Last colonoscopy: 03/2016; polyps. Due again 5 years Last DEXA: 05/2017 T-1.1 (stable from prior DEXA elsewhere).  Dentist: 2x/year, cleanings 4x/year Ophtho: twice yearly  Exercise: no regular exercise currently.  Other doctors caring for patient include: Ophtho: [place] [facility] ophtho Dentist: Dr. Radford Pax GI: Dr. Earlean Shawl Podiatrist ([place])  Depression screen: negative Fall screen: negative Functional Status survey: unremarkable Mini-Cog screen normal. See epic for full questionnaires.  End of Life Discussion: Patient hasa living will and medical power of attorney  Past Medical History:  Diagnosis Date  . COPD (chronic obstructive pulmonary disease) ([place])   . Dietary surveillance and counseling   . Diverticulosis 2012  . Glaucoma    Dr. Ellie Lunch  . Gouty arthropathy    R foot  . Hx of adenomatous colonic polyps 2012   Dr. Earlean Shawl  . Hyperlipidemia 1997  . Hypertension    controlled  . Internal hemorrhoids 2012  . LBP (low back pain)   . Osteopenia 04/2010   (DEXA ordered by Dr. Ubaldo Glassing, scheduled for 04/2012)  . Postmenopausal   . Prediabetes   . Smoker   . Vitamin D deficiency     Past Surgical History:  Procedure Laterality Date  . APPENDECTOMY  1966  . BAND HEMORRHOIDECTOMY  06/2011   Dr. Earlean Shawl  . COLONOSCOPY  03/2011   Dr. Earlean Shawl  . [place]    Social History   Socioeconomic  History  . Marital status: Married    Spouse name: Not on file  . Number of children: 2  . Years of education: Not on file  . Highest education level: Not on file  Occupational History  . Occupation: retired Transport planner)  Social Needs  . Financial resource strain: Not on file  . Food insecurity:    Worry: Not on file    Inability: Not on file   . Transportation needs:    Medical: Not on file    Non-medical: Not on file  Tobacco Use  . Smoking status: Current Some Day Smoker    Packs/day: 0.30    Years: 40.00    Pack years: 12.00    Types: Cigarettes  . Smokeless tobacco: Never Used  . Tobacco comment: smoking 0-6/day, often just 3, but not every day  Substance and Sexual Activity  . Alcohol use: Yes    Alcohol/week: 0.0 standard drinks    Comment: 2 drinks per week.  . Drug use: No  . Sexual activity: Yes    Partners: Male  Lifestyle  . Physical activity:    Days per week: Not on file    Minutes per session: Not on file  . Stress: Not on file  Relationships  . Social connections:    Talks on phone: Not on file    Gets together: Not on file    Attends religious service: Not on file    Active member of club or organization: Not on file    Attends meetings of clubs or organizations: Not on file    Relationship status: Not on file  . Intimate partner violence:    Fear of current or ex partner: Not on file    Emotionally abused: Not on file    Physically abused: Not on file    Forced sexual activity: Not on file  Other Topics Concern  . Not on file  Social History Narrative   Lives with husband.  1 daughter in [place], the other daughter lives down the street. 1 stepdaughter (who has 4 children), all local. 2 stepgrandchildren (from her local daughter). 1 great granddaughter (stepgrandchild's daughter)      Husband Milbert Coulter was diagnosed (01/2018) with Parkinson's disease.  Very mild tremor, no other noticeable symptoms yet.    Family History  Problem Relation Age of Onset  . Alzheimer's disease Mother   . Hypertension Mother   . Stroke Mother 75  . Heart disease Father   . Diabetes Father   . Hypertension Brother   . Diabetes Brother   . Cancer Neg Hx   . Breast cancer Neg Hx     Outpatient Encounter Medications as of 11/02/2018  Medication Sig  . allopurinol (ZYLOPRIM) 100 MG tablet TAKE 1 TABLET BY MOUTH   DAILY  . aspirin 81 MG tablet Take 1 tablet (81 mg total) by mouth daily.  . Calcium Carbonate-Vitamin D (CALCIUM 600+D) 600-400 MG-UNIT per tablet Take 1 tablet by mouth daily.  . cholecalciferol (VITAMIN D) 1000 UNITS tablet Take 1,000 Units by mouth daily.  . dorzolamide-timolol (COSOPT) 22.3-6.8 MG/ML ophthalmic solution Place 1 drop into both eyes 2 (two) times daily.  . indapamide (LOZOL) 2.5 MG tablet TAKE 1 TABLET BY MOUTH  EVERY MORNING  . potassium chloride 20 MEQ/15ML (10%) SOLN TAKE 30ML 3 TIMES WEEKLY ON MONDAY, WEDNESDAY, AND  FRIDAY AND 15ML 4 TIMES A  WEEK ON ALL OTHER DAYS  . simvastatin (ZOCOR) 40 MG tablet TAKE 1 TABLET BY MOUTH  EVERY EVENING   No facility-administered encounter medications on file as of 11/02/2018.     No Known Allergies  ROS: The patient denies anorexia, fever, weight changes, headaches, vision changes, decreased hearing, ear pain, sore throat, breast concerns, chest pain, palpitations, dizziness, syncope, dyspnea on exertion, cough, swelling, nausea, vomiting, diarrhea, constipation, abdominal pain, melena, hematochezia, indigestion/heartburn, hematuria, incontinence, dysuria, vaginal bleeding, discharge, odor or itch, genital lesions, joint pains, numbness, tingling, weakness, tremor, suspicious skin lesions, depression, anxiety, abnormal bleeding/bruising, or enlarged lymph nodes.    PHYSICAL EXAM:  BP 128/68   Pulse 80   Ht 5' (1.524 m)   Wt 127 lb 12.8 oz (58 kg)   BMI 24.96 kg/m   Wt Readings from Last 3 Encounters:  11/02/18 127 lb 12.8 oz (58 kg)  04/13/18 132 lb (59.9 kg)  10/04/17 132 lb (59.9 kg)    General Appearance:  Alert, cooperative, no distress, appears stated age   Head:  Normocephalic, without obvious abnormality, atraumatic   Eyes:  PERRL, conjunctiva/corneas clear, EOM's intact, fundi benign   Ears:  Normal TM's and external ear canals   Nose:  Nares normal, mucosa is normal, no drainage or sinus tenderness    Throat:  Lips, mucosa, and tongue normal; teeth and gums normal. Torus pallatini superiorly  Neck:  Supple, no lymphadenopathy; thyroid: no enlargement/ tenderness/nodules; no carotid bruit or JVD. Slightly prominent submandibular glands noted, symmetric, unchanged  Back:  Spine nontender, no curvature, ROM normal, no CVA tenderness.   Lungs:  Clear to auscultation bilaterally without wheezes, rales or ronchi; respirations unlabored   Chest Wall:  No tenderness or deformity   Heart:  Regular rate and rhythm, S1 and S2 normal, no murmur, rub or gallop   Breast Exam:  No tenderness, masses, or nipple discharge or inversion. No axillary lymphadenopathy. Large, pendulous breasts   Abdomen:  Soft, non-tender, nondistended, normoactive bowel sounds, no masses, no hepatosplenomegaly. Diastasis recti and prominent xiphoid process noted (nontender)  Genitalia:  Normal external genitalia without lesions. BUS and vagina normal; no cervical motion tenderness. No abnormal vaginal discharge. Uterus and adnexa not enlarged, nontender, no masses. Pap not performed   Rectal:  Normal tone, no masses or tenderness; guaiac negative stool   Extremities:  No clubbing, cyanosis or edema   Pulses:  2+ and symmetric all extremities   Skin:  Skin color, texture, turgor normal, no rashes or lesions   Lymph nodes:  Cervical, supraclavicular, and axillary nodes normal   Neurologic:  CNII-XII intact, normal strength, sensation and gait; reflexes 2+ and symmetric throughout   Psych:  Normal mood, affect, hygiene and grooming    ASSESSMENT/PLAN:  Medicare annual wellness visit, subsequent  Essential hypertension, benign - well controlled, continue current regimen - Plan: Comprehensive metabolic panel  Hypokalemia - Plan: Comprehensive metabolic panel  Pure hypercholesterolemia - at goal per last check; continue current regimen and lowfat, low cholesterol diet  Gouty  arthropathy - continue allopurinol  Medication monitoring encounter - Plan: Comprehensive metabolic panel  Osteopenia, unspecified location - minimal; continue Ca, D, encouraged weight-bearing exercise  Prediabetes - normal A1c.  Weight is now at goal. daily exercise encouraged - Plan: HgB A1c  Tobacco use disorder - counseled extensively, encouraged cessation   No refills needed today per pt.  Discussed monthly self breast exams and yearly mammograms; at least 30 minutes of aerobic activity at least 5 days/week and weight-bearing exercise 2x/week; proper sunscreen use reviewed; healthy diet, including goals of calcium and vitamin D intake and  alcohol recommendations (less than or equal to 1 drink/day) reviewed; regular seatbelt use; changing batteries in smoke detectors. Immunization recommendations discussed. High dose flu shots are recommended yearly--she declines. Recommended Shingrix, side effects reviewed--needs to get from pharmacy.Colonoscopy recommendations reviewed, UTD, due again 03/2021. No further DEXA needed. Pap smear next year.  MOST form reviewed, Full Code, Full Care    Medicare Attestation I have personally reviewed: The patient's medical and social history Their use of alcohol, tobacco or illicit drugs Their current medications and supplements The patient's functional ability including ADLs,fall risks, home safety risks, cognitive, and hearing and visual impairment Diet and physical activities Evidence for depression or mood disorders  The patient's weight, height and BMI have been recorded in the chart.  I have made referrals, counseling, and provided education to the patient based on review of the above and I have provided the patient with a written personalized care plan for preventive services.

## 2018-11-01 NOTE — Patient Instructions (Addendum)
  HEALTH MAINTENANCE RECOMMENDATIONS:  It is recommended that you get at least 30 minutes of aerobic exercise at least 5 days/week (for weight loss, you may need as much as 60-90 minutes). This can be any activity that gets your heart rate up. This can be divided in 10-15 minute intervals if needed, but try and build up your endurance at least once a week.  Weight bearing exercise is also recommended twice weekly.  Eat a healthy diet with lots of vegetables, fruits and fiber.  "Colorful" foods have a lot of vitamins (ie green vegetables, tomatoes, red peppers, etc).  Limit sweet tea, regular sodas and alcoholic beverages, all of which has a lot of calories and sugar.  Up to 1 alcoholic drink daily may be beneficial for women (unless trying to lose weight, watch sugars).  Drink a lot of water.  Calcium recommendations are 1200-1500 mg daily (1500 mg for postmenopausal women or women without ovaries), and vitamin D 1000 IU daily.  This should be obtained from diet and/or supplements (vitamins), and calcium should not be taken all at once, but in divided doses.  Monthly self breast exams and yearly mammograms for women over the age of 78 is recommended.  Sunscreen of at least SPF 30 should be used on all sun-exposed parts of the skin when outside between the hours of 10 am and 4 pm (not just when at beach or pool, but even with exercise, golf, tennis, and yard work!)  Use a sunscreen that says "broad spectrum" so it covers both UVA and UVB rays, and make sure to reapply every 1-2 hours.  Remember to change the batteries in your smoke detectors when changing your clock times in the spring and fall.  Use your seat belt every time you are in a car, and please drive safely and not be distracted with cell phones and texting while driving.   Bailey Parker , Thank you for taking time to come for your Medicare Wellness Visit. I appreciate your ongoing commitment to your health goals. Please review the following  plan we discussed and let me know if I can assist you in the future.    This is a list of the screening recommended for you and due dates:  Health Maintenance  Topic Date Due  . Flu Shot  04/21/2018  . Mammogram  06/02/2020  . Colon Cancer Screening  04/13/2021  . Tetanus Vaccine  09/30/2026  . DEXA scan (bone density measurement)  Completed  .  Hepatitis C: One time screening is recommended by Center for Disease Control  (CDC) for  adults born from [birth year] through 1967.   Completed  . Pneumonia vaccines  Completed   High dose flu shots are recommended yearly (in the Fall). Your next mammogram is due 05/2019 (not 2021 as stated above) No further bone density tests are needed. We will do your pap smear again next year.  I recommend getting the new shingles vaccine (Shingrix). You need to get from the pharmacy rather than our office, as it is covered by Medicare Part D.  It is a series of 2 injections, spaced 2 months apart.  Please work on quitting smoking (stop buying cigarettes!) and getting exercise regularly as we discussed.

## 2018-11-02 ENCOUNTER — Encounter: Payer: Self-pay | Admitting: Family Medicine

## 2018-11-02 ENCOUNTER — Ambulatory Visit (INDEPENDENT_AMBULATORY_CARE_PROVIDER_SITE_OTHER): Payer: Medicare Other | Admitting: Family Medicine

## 2018-11-02 VITALS — BP 128/68 | HR 80 | Ht 60.0 in | Wt 127.8 lb

## 2018-11-02 DIAGNOSIS — F17200 Nicotine dependence, unspecified, uncomplicated: Secondary | ICD-10-CM

## 2018-11-02 DIAGNOSIS — Z5181 Encounter for therapeutic drug level monitoring: Secondary | ICD-10-CM | POA: Diagnosis not present

## 2018-11-02 DIAGNOSIS — R7303 Prediabetes: Secondary | ICD-10-CM

## 2018-11-02 DIAGNOSIS — M8580 Other specified disorders of bone density and structure, unspecified site: Secondary | ICD-10-CM

## 2018-11-02 DIAGNOSIS — E876 Hypokalemia: Secondary | ICD-10-CM

## 2018-11-02 DIAGNOSIS — Z0000 Encounter for general adult medical examination without abnormal findings: Secondary | ICD-10-CM | POA: Diagnosis not present

## 2018-11-02 DIAGNOSIS — M109 Gout, unspecified: Secondary | ICD-10-CM

## 2018-11-02 DIAGNOSIS — E7800 Pure hypercholesterolemia, unspecified: Secondary | ICD-10-CM

## 2018-11-02 DIAGNOSIS — I10 Essential (primary) hypertension: Secondary | ICD-10-CM

## 2018-11-02 LAB — POCT GLYCOSYLATED HEMOGLOBIN (HGB A1C): HEMOGLOBIN A1C: 5.6 % (ref 4.0–5.6)

## 2018-11-03 ENCOUNTER — Encounter: Payer: Self-pay | Admitting: Family Medicine

## 2018-11-03 LAB — COMPREHENSIVE METABOLIC PANEL
A/G RATIO: 1.5 (ref 1.2–2.2)
ALT: 14 IU/L (ref 0–32)
AST: 20 IU/L (ref 0–40)
Albumin: 4.4 g/dL (ref 3.8–4.8)
Alkaline Phosphatase: 81 IU/L (ref 39–117)
BILIRUBIN TOTAL: 0.3 mg/dL (ref 0.0–1.2)
BUN/Creatinine Ratio: 18 (ref 12–28)
BUN: 12 mg/dL (ref 8–27)
CALCIUM: 10.2 mg/dL (ref 8.7–10.3)
CO2: 27 mmol/L (ref 20–29)
Chloride: 100 mmol/L (ref 96–106)
Creatinine, Ser: 0.66 mg/dL (ref 0.57–1.00)
GFR, EST AFRICAN AMERICAN: 104 mL/min/{1.73_m2} (ref >59)
GFR, EST NON AFRICAN AMERICAN: 90 mL/min/{1.73_m2} (ref >59)
GLOBULIN, TOTAL: 2.9 g/dL (ref 1.5–4.5)
Glucose: 84 mg/dL (ref 65–99)
POTASSIUM: 3.8 mmol/L (ref 3.5–5.2)
SODIUM: 143 mmol/L (ref 134–144)
Total Protein: 7.3 g/dL (ref 6.0–8.5)

## 2018-11-17 ENCOUNTER — Other Ambulatory Visit: Payer: Self-pay | Admitting: Family Medicine

## 2018-11-17 DIAGNOSIS — E7800 Pure hypercholesterolemia, unspecified: Secondary | ICD-10-CM

## 2018-11-17 DIAGNOSIS — M109 Gout, unspecified: Secondary | ICD-10-CM

## 2018-12-01 ENCOUNTER — Other Ambulatory Visit: Payer: Self-pay | Admitting: *Deleted

## 2018-12-01 DIAGNOSIS — E876 Hypokalemia: Secondary | ICD-10-CM

## 2018-12-01 DIAGNOSIS — I10 Essential (primary) hypertension: Secondary | ICD-10-CM

## 2018-12-01 MED ORDER — INDAPAMIDE 2.5 MG PO TABS: 2.5000 mg | ORAL_TABLET | Freq: Every morning | ORAL | 1 refills | Status: DC

## 2018-12-01 MED ORDER — POTASSIUM CHLORIDE 20 MEQ/15ML (10%) PO SOLN: ORAL | 1 refills | Status: DC

## 2019-04-11 ENCOUNTER — Other Ambulatory Visit: Payer: Self-pay | Admitting: Family Medicine

## 2019-04-11 DIAGNOSIS — I10 Essential (primary) hypertension: Secondary | ICD-10-CM

## 2019-04-11 DIAGNOSIS — E876 Hypokalemia: Secondary | ICD-10-CM

## 2019-04-11 DIAGNOSIS — M109 Gout, unspecified: Secondary | ICD-10-CM

## 2019-04-11 DIAGNOSIS — E7800 Pure hypercholesterolemia, unspecified: Secondary | ICD-10-CM

## 2019-04-11 NOTE — Telephone Encounter (Signed)
Allopurinol and simvastatin was filled 11/17/18 so she will be out before appt. Is it okay to refill allopruinol for #90?  Lozol and Potassium was filled on 12/01/18. She should have enough until September. I will deny this as she has an appt at end of august

## 2019-04-18 ENCOUNTER — Other Ambulatory Visit: Payer: Self-pay | Admitting: Family Medicine

## 2019-04-18 DIAGNOSIS — I10 Essential (primary) hypertension: Secondary | ICD-10-CM

## 2019-04-26 ENCOUNTER — Other Ambulatory Visit: Payer: Self-pay | Admitting: Family Medicine

## 2019-04-26 DIAGNOSIS — Z1231 Encounter for screening mammogram for malignant neoplasm of breast: Secondary | ICD-10-CM

## 2019-05-10 ENCOUNTER — Telehealth: Payer: Self-pay | Admitting: *Deleted

## 2019-05-10 DIAGNOSIS — M109 Gout, unspecified: Secondary | ICD-10-CM

## 2019-05-10 DIAGNOSIS — Z5181 Encounter for therapeutic drug level monitoring: Secondary | ICD-10-CM

## 2019-05-10 DIAGNOSIS — I10 Essential (primary) hypertension: Secondary | ICD-10-CM

## 2019-05-10 DIAGNOSIS — E876 Hypokalemia: Secondary | ICD-10-CM

## 2019-05-10 DIAGNOSIS — E7800 Pure hypercholesterolemia, unspecified: Secondary | ICD-10-CM

## 2019-05-10 NOTE — Telephone Encounter (Signed)
She is scheduled for AWV, but she HAD her AWV in February.  She is only due for med check.  The med check can be done virtually--so have her get vitals when she comes for the labs next week, and do virtual med check as scheduled on Wed (can change to 30 min visit).  Thanks

## 2019-05-10 NOTE — Telephone Encounter (Signed)
Patient is coming in next Tues for labs for AWV on Wed-need orders please and thanks.

## 2019-05-16 ENCOUNTER — Other Ambulatory Visit: Payer: Medicare Other

## 2019-05-16 ENCOUNTER — Other Ambulatory Visit: Payer: Self-pay

## 2019-05-16 DIAGNOSIS — E7800 Pure hypercholesterolemia, unspecified: Secondary | ICD-10-CM | POA: Diagnosis not present

## 2019-05-16 DIAGNOSIS — I10 Essential (primary) hypertension: Secondary | ICD-10-CM

## 2019-05-16 DIAGNOSIS — Z5181 Encounter for therapeutic drug level monitoring: Secondary | ICD-10-CM

## 2019-05-16 DIAGNOSIS — E876 Hypokalemia: Secondary | ICD-10-CM | POA: Diagnosis not present

## 2019-05-16 DIAGNOSIS — M109 Gout, unspecified: Secondary | ICD-10-CM

## 2019-05-16 NOTE — Progress Notes (Signed)
Start time: 8:34--not started until 8:37 due to technical difficulties End time:  9:07  Virtual Visit via Telephone Note  I connected with Bailey Parker on 05/17/2019 by phone, after failed attempt at video visit (she wasn't able to connect).  I verified that I am speaking with the correct person using two identifiers. She consents to insurance being filed for this visit.  Location: Patient: home, in room alone Provider: office   I discussed the limitations of evaluation and management by telemedicine and the availability of in person appointments. The patient expressed understanding and agreed to proceed.  History of Present Illness:  Chief Complaint  Patient presents with  . Hypertension    med check, no concerns.    Patient presents for 6 month follow-up on chronic problems. She had labs drawn yesterday, see below for results.  Hypertension follow-up: Blood pressuresused to be checked at the pharmacy, but hasn't been going, so not checking it. Doesn't have monitor at home (has one, can't get it to work properly).  She may be doing a little more canned foods recently.  She is still walking, and doing some weights now as well. Denies dizziness, headaches, chest pain, edema, muscle cramps. Denies side effects of medications.Compliant with medications.  H/o hypokalemia--related to diuretic use. She is using 40 mEqon Mon/Wed/Fri, 20 mEq the other days. She denies muscle cramps. Reports compliance with the liquid potassium supplement. Lab Results  Component Value Date   K 3.8 11/02/2018   Hyperlipidemia follow-up: Patient is reportedly following a low-fat, low cholesterol diet. Compliant with medications and denies medication side effects.   Gout: Denies any flares. Compliant withlow dose allopurinol. Denies any flares.  Smoking:Admits to still smoking some. Smoking 3-5 cigarettes/day."It is just a habit".  It is usually after a meal.  She finds that it helps her with her  stress. Feels some stress--"why people are so mean, and don't do what they're supposed to do". Uses smoking to help her relax. She limits her news watching, and is overall doing very well. She is "thankful and grateful".  H/o pre-diabetes: She continues to watch her diet (limit sweets, portions).Last check of A1c was normal. Last abnormal was in 2016, highest was A1c 5.8. Fasting sugars have been normal.  PMH, PSH, SH reviewed  Outpatient Encounter Medications as of 05/17/2019  Medication Sig  . allopurinol (ZYLOPRIM) 100 MG tablet TAKE 1 TABLET BY MOUTH  DAILY  . aspirin 81 MG tablet Take 1 tablet (81 mg total) by mouth daily.  . Calcium Carbonate-Vitamin D (CALCIUM 600+D) 600-400 MG-UNIT per tablet Take 1 tablet by mouth daily.  . cholecalciferol (VITAMIN D) 1000 UNITS tablet Take 1,000 Units by mouth daily.  . dorzolamide-timolol (COSOPT) 22.3-6.8 MG/ML ophthalmic solution Place 1 drop into both eyes 2 (two) times daily.  . indapamide (LOZOL) 2.5 MG tablet Take 1 tablet (2.5 mg total) by mouth every morning.  . potassium chloride 20 MEQ/15ML (10%) SOLN TAKE 30ML 3 TIMES WEEKLY ON MONDAY, WEDNESDAY, AND  FRIDAY AND 15ML 4 TIMES A  WEEK ON ALL OTHER DAYS  . simvastatin (ZOCOR) 40 MG tablet TAKE 1 TABLET BY MOUTH  EVERY EVENING   No facility-administered encounter medications on file as of 05/17/2019.    No Known Allergies  ROS: denies headaches, dizziness, chest pain, palpitations, shortness of breath, edema.  Denies fever, chills, URI symptoms, cough.  She denies any GI or GU complaints, bleeding, bruising, rashes.  Some stress, as reported above, but handling it well.  Observations/Objective:  BP 140/90   Pulse 67   Ht 5' (1.524 m)   Wt 131 lb 9.6 oz (59.7 kg)   SpO2 97%   BMI 25.70 kg/m   BP was in office for lab visit yesterday. Wt Readings from Last 3 Encounters:  05/16/19 131 lb 9.6 oz (59.7 kg)  11/02/18 127 lb 12.8 oz (58 kg)  04/13/18 132 lb (59.9 kg)   Patient  is alert, oriented, in good spirits. Normal speech. Exam is limited due to the virtual nature of the visit.    Chemistry      Component Value Date/Time   NA 141 05/16/2019 0947   K 3.5 05/16/2019 0947   CL 101 05/16/2019 0947   CO2 25 05/16/2019 0947   BUN 9 05/16/2019 0947   CREATININE 0.71 05/16/2019 0947   CREATININE 0.66 09/30/2017 0916      Component Value Date/Time   CALCIUM 9.5 05/16/2019 0947   ALKPHOS 75 05/16/2019 0947   AST 16 05/16/2019 0947   ALT 10 05/16/2019 0947   BILITOT 0.3 05/16/2019 0947     Fasting glu 95  Lab Results  Component Value Date   LABURIC 3.8 05/16/2019   Lab Results  Component Value Date   CHOL 117 05/16/2019   HDL 45 05/16/2019   LDLCALC 58 05/16/2019   TRIG 68 05/16/2019   CHOLHDL 2.6 05/16/2019    Assessment and Plan:  Essential hypertension, benign - elevated/borderline at lab visit yest; encouraged to get new monitor, NV to verify accuracy, low Na diet  Hypokalemia - adequately replaced (lower limit of normal); continue rx supplement  Pure hypercholesterolemia - at goal, continue current treatment and low cholesterol diet.  Gouty arthropathy - continue allopurinol.  No flares and uric acid level is at goal  Tobacco use disorder - counseled extensively regarding risks, alternatives, other ways to relax and deal with stress   Declines refilling meds now, prefers to call pharm when needed (indapamide and potassium will be due mid-September, others are good until October).  Needs to schedule AWV for February morning appt (declines labs prior--will be fasting)    Follow Up Instructions:  Mail AWV to pt   I discussed the assessment and treatment plan with the patient. The patient was provided an opportunity to ask questions and all were answered. The patient agreed with the plan and demonstrated an understanding of the instructions.   The patient was advised to call back or seek an in-person evaluation if the symptoms  worsen or if the condition fails to improve as anticipated.  I provided 30 minutes of non-face-to-face time during this encounter.   Vikki Ports, MD

## 2019-05-17 ENCOUNTER — Ambulatory Visit (INDEPENDENT_AMBULATORY_CARE_PROVIDER_SITE_OTHER): Payer: Medicare Other | Admitting: Family Medicine

## 2019-05-17 ENCOUNTER — Encounter: Payer: Self-pay | Admitting: Family Medicine

## 2019-05-17 VITALS — BP 140/90 | HR 67 | Ht 60.0 in | Wt 131.6 lb

## 2019-05-17 DIAGNOSIS — E876 Hypokalemia: Secondary | ICD-10-CM

## 2019-05-17 DIAGNOSIS — M109 Gout, unspecified: Secondary | ICD-10-CM | POA: Diagnosis not present

## 2019-05-17 DIAGNOSIS — F17200 Nicotine dependence, unspecified, uncomplicated: Secondary | ICD-10-CM | POA: Diagnosis not present

## 2019-05-17 DIAGNOSIS — E7800 Pure hypercholesterolemia, unspecified: Secondary | ICD-10-CM | POA: Diagnosis not present

## 2019-05-17 DIAGNOSIS — I10 Essential (primary) hypertension: Secondary | ICD-10-CM

## 2019-05-17 LAB — COMPREHENSIVE METABOLIC PANEL
ALT: 10 IU/L (ref 0–32)
AST: 16 IU/L (ref 0–40)
Albumin/Globulin Ratio: 1.6 (ref 1.2–2.2)
Albumin: 4.4 g/dL (ref 3.8–4.8)
Alkaline Phosphatase: 75 IU/L (ref 39–117)
BUN/Creatinine Ratio: 13 (ref 12–28)
BUN: 9 mg/dL (ref 8–27)
Bilirubin Total: 0.3 mg/dL (ref 0.0–1.2)
CO2: 25 mmol/L (ref 20–29)
Calcium: 9.5 mg/dL (ref 8.7–10.3)
Chloride: 101 mmol/L (ref 96–106)
Creatinine, Ser: 0.71 mg/dL (ref 0.57–1.00)
GFR calc Af Amer: 100 mL/min/{1.73_m2} (ref >59)
GFR calc non Af Amer: 87 mL/min/{1.73_m2} (ref >59)
Globulin, Total: 2.8 g/dL (ref 1.5–4.5)
Glucose: 95 mg/dL (ref 65–99)
Potassium: 3.5 mmol/L (ref 3.5–5.2)
Sodium: 141 mmol/L (ref 134–144)
Total Protein: 7.2 g/dL (ref 6.0–8.5)

## 2019-05-17 LAB — LIPID PANEL
Chol/HDL Ratio: 2.6 ratio (ref 0.0–4.4)
Cholesterol, Total: 117 mg/dL (ref 100–199)
HDL: 45 mg/dL (ref >39)
LDL Calculated: 58 mg/dL (ref 0–99)
Triglycerides: 68 mg/dL (ref 0–149)
VLDL Cholesterol Cal: 14 mg/dL (ref 5–40)

## 2019-05-17 LAB — URIC ACID: Uric Acid: 3.8 mg/dL (ref 2.5–7.1)

## 2019-05-17 NOTE — Progress Notes (Signed)
Done

## 2019-05-17 NOTE — Patient Instructions (Addendum)
Your blood pressure was a little higher than I'd like--not sure if it was simply due to being in the office, or if it is really running high in general.  If you are getting another monitor, please schedule nurse visit so that we can verify the accuracy, to ensure that we can trust the readings from it.  Then, if accurate, send [place] (or call with) a list of your blood pressures sometime in the next 4-6 weeks.  Continue to get regular exercise, and limit the sodium in your diet. See information below.   Please try and quit smoking--start thinking about why/when you smoke (habit, boredom, stress) in order to come up with effective strategies to cut back or quit. Available resources to help you quit include free counseling through [facility] Quitline (NCQuitline.com or 1-800-QUITNOW), smoking cessation classes through [facility] (call to find out schedule), over-the-counter nicotine replacements, and e-cigarettes (although this may not help break the hand-mouth habit).  Many insurance companies also have smoking cessation programs (which may decrease the cost of patches, meds if enrolled).  If these methods are not effective for you, and you are motivated to quit, return to discuss the possibility of prescription medications.   Mindfulness-Based Stress Reduction Mindfulness-based stress reduction (MBSR) is a program that helps people learn to practice mindfulness. Mindfulness is the practice of intentionally paying attention to the present moment. It can be learned and practiced through techniques such as education, breathing exercises, meditation, and yoga. MBSR includes several mindfulness techniques in one program. MBSR works best when you understand the treatment, are willing to try new things, and can commit to spending time practicing what you learn. MBSR training may include learning about:  How your emotions, thoughts, and reactions affect your body.  New ways to respond to things that  cause negative thoughts to start (triggers).  How to notice your thoughts and let go of them.  Practicing awareness of everyday things that you normally do without thinking.  The techniques and goals of different types of meditation. What are the benefits of MBSR? MBSR can have many benefits, which include helping you to:  Develop self-awareness. This refers to knowing and understanding yourself.  Learn skills and attitudes that help you to participate in your own health care.  Learn new ways to care for yourself.  Be more accepting about how things are, and let things go.  Be less judgmental and approach things with an open mind.  Be patient with yourself and trust yourself more. MBSR has also been shown to:  Reduce negative emotions, such as depression and anxiety.  Improve memory and focus.  Change how you sense and approach pain.  Boost your body's ability to fight infections.  Help you connect better with other people.  Improve your sense of well-being. Follow these instructions at home:   Find a local in-person or online MBSR program.  Set aside some time regularly for mindfulness practice.  Find a mindfulness practice that works best for you. This may include one or more of the following: ? Meditation. Meditation involves focusing your mind on a certain thought or activity. ? Breathing awareness exercises. These help you to stay present by focusing on your breath. ? Body scan. For this practice, you lie down and pay attention to each part of your body from head to toe. You can identify tension and soreness and intentionally relax parts of your body. ? Yoga. Yoga involves stretching and breathing, and it can improve your  ability to move and be flexible. It can also provide an experience of testing your body's limits, which can help you release stress. ? Mindful eating. This way of eating involves focusing on the taste, texture, color, and smell of each bite of  food. Because this slows down eating and helps you feel full sooner, it can be an important part of a weight-loss plan.  Find a podcast or recording that provides guidance for breathing awareness, body scan, or meditation exercises. You can listen to these any time when you have a free moment to rest without distractions.  Follow your treatment plan as told by your health care provider. This may include taking regular medicines and making changes to your diet or lifestyle as recommended. How to practice mindfulness To do a basic awareness exercise:  Find a comfortable place to sit.  Pay attention to the present moment. Observe your thoughts, feelings, and surroundings just as they are.  Avoid placing judgment on yourself, your feelings, or your surroundings. Make note of any judgment that comes up, and let it go.  Your mind may wander, and that is okay. Make note of when your thoughts drift, and return your attention to the present moment. To do basic mindfulness meditation:  Find a comfortable place to sit. This may include a stable chair or a firm floor cushion. ? Sit upright with your back straight. Let your arms fall next to your side with your hands resting on your legs. ? If sitting in a chair, rest your feet flat on the floor. ? If sitting on a cushion, cross your legs in front of you.  Keep your head in a neutral position with your chin dropped slightly. Relax your jaw and rest the tip of your tongue on the roof of your mouth. Drop your gaze to the floor. You can close your eyes if you like.  Breathe normally and pay attention to your breath. Feel the air moving in and out of your nose. Feel your belly expanding and relaxing with each breath.  Your mind may wander, and that is okay. Make note of when your thoughts drift, and return your attention to your breath.  Avoid placing judgment on yourself, your feelings, or your surroundings. Make note of any judgment or feelings that  come up, let them go, and bring your attention back to your breath.  When you are ready, lift your gaze or open your eyes. Pay attention to how your body feels after the meditation. Where to find more information You can find more information about MBSR from:  Your health care provider.  Community-based meditation centers or programs.  Programs offered near you. Summary  Mindfulness-based stress reduction (MBSR) is a program that teaches you how to intentionally pay attention to the present moment. It is used with other treatments to help you cope better with daily stress, emotions, and pain.  MBSR focuses on developing self-awareness, which allows you to respond to life stress without judgment or negative emotions.  MBSR programs may involve learning different mindfulness practices, such as breathing exercises, meditation, yoga, body scan, or mindful eating. Find a mindfulness practice that works best for you, and set aside time for it on a regular basis. This information is not intended to replace advice given to you by your health care provider. Make sure you discuss any questions you have with your health care provider. Document Released: 01/14/2017 Document Revised: 08/20/2017 Document Reviewed: 01/14/2017 Elsevier Patient Education  2020 [employer].  Low-Sodium Eating Plan Sodium, which is an element that makes up salt, helps you maintain a healthy balance of fluids in your body. Too much sodium can increase your blood pressure and cause fluid and waste to be held in your body. Your health care provider or dietitian may recommend following this plan if you have high blood pressure (hypertension), kidney disease, liver disease, or heart failure. Eating less sodium can help lower your blood pressure, reduce swelling, and protect your heart, liver, and kidneys. What are tips for following this plan? General guidelines  Most people on this plan should limit their sodium intake to  1,500-2,000 mg (milligrams) of sodium each day. Reading food labels   The Nutrition Facts label lists the amount of sodium in one serving of the food. If you eat more than one serving, you must multiply the listed amount of sodium by the number of servings.  Choose foods with less than 140 mg of sodium per serving.  Avoid foods with 300 mg of sodium or more per serving. Shopping  Look for lower-sodium products, often labeled as "low-sodium" or "no salt added."  Always check the sodium content even if foods are labeled as "unsalted" or "no salt added".  Buy fresh foods. ? Avoid canned foods and premade or frozen meals. ? Avoid canned, cured, or processed meats  Buy breads that have less than 80 mg of sodium per slice. Cooking  Eat more home-cooked food and less restaurant, buffet, and fast food.  Avoid adding salt when cooking. Use salt-free seasonings or herbs instead of table salt or sea salt. Check with your health care provider or pharmacist before using salt substitutes.  Cook with plant-based oils, such as canola, sunflower, or olive oil. Meal planning  When eating at a restaurant, ask that your food be prepared with less salt or no salt, if possible.  Avoid foods that contain MSG (monosodium glutamate). MSG is sometimes added to [place] food, bouillon, and some canned foods. What foods are recommended? The items listed may not be a complete list. Talk with your dietitian about what dietary choices are best for you. Grains Low-sodium cereals, including oats, puffed wheat and rice, and shredded wheat. Low-sodium crackers. Unsalted rice. Unsalted pasta. Low-sodium bread. Whole-grain breads and whole-grain pasta. Vegetables Fresh or frozen vegetables. "No salt added" canned vegetables. "No salt added" tomato sauce and paste. Low-sodium or reduced-sodium tomato and vegetable juice. Fruits Fresh, frozen, or canned fruit. Fruit juice. Meats and other protein foods Fresh or  frozen (no salt added) meat, poultry, seafood, and fish. Low-sodium canned tuna and salmon. Unsalted nuts. Dried peas, beans, and lentils without added salt. Unsalted canned beans. Eggs. Unsalted nut butters. Dairy Milk. Soy milk. Cheese that is naturally low in sodium, such as ricotta cheese, fresh mozzarella, or Swiss cheese Low-sodium or reduced-sodium cheese. Cream cheese. Yogurt. Fats and oils Unsalted butter. Unsalted margarine with no trans fat. Vegetable oils such as canola or olive oils. Seasonings and other foods Fresh and dried herbs and spices. Salt-free seasonings. Low-sodium mustard and ketchup. Sodium-free salad dressing. Sodium-free light mayonnaise. Fresh or refrigerated horseradish. Lemon juice. Vinegar. Homemade, reduced-sodium, or low-sodium soups. Unsalted popcorn and pretzels. Low-salt or salt-free chips. What foods are not recommended? The items listed may not be a complete list. Talk with your dietitian about what dietary choices are best for you. Grains Instant hot cereals. Bread stuffing, pancake, and biscuit mixes. Croutons. Seasoned rice or pasta mixes. Noodle soup cups. Boxed or frozen macaroni and cheese. Regular  salted crackers. Self-rising flour. Vegetables Sauerkraut, pickled vegetables, and relishes. Olives. [place] fries. Onion rings. Regular canned vegetables (not low-sodium or reduced-sodium). Regular canned tomato sauce and paste (not low-sodium or reduced-sodium). Regular tomato and vegetable juice (not low-sodium or reduced-sodium). Frozen vegetables in sauces. Meats and other protein foods Meat or fish that is salted, canned, smoked, spiced, or pickled. Bacon, ham, sausage, hotdogs, corned beef, chipped beef, packaged lunch meats, salt pork, jerky, pickled herring, anchovies, regular canned tuna, sardines, salted nuts. Dairy Processed cheese and cheese spreads. Cheese curds. Blue cheese. Feta cheese. String cheese. Regular cottage cheese. Buttermilk. Canned  milk. Fats and oils Salted butter. Regular margarine. Ghee. Bacon fat. Seasonings and other foods Onion salt, garlic salt, seasoned salt, table salt, and sea salt. Canned and packaged gravies. Worcestershire sauce. Tartar sauce. Barbecue sauce. Teriyaki sauce. Soy sauce, including reduced-sodium. Steak sauce. Fish sauce. Oyster sauce. Cocktail sauce. Horseradish that you find on the shelf. Regular ketchup and mustard. Meat flavorings and tenderizers. Bouillon cubes. Hot sauce and Tabasco sauce. Premade or packaged marinades. Premade or packaged taco seasonings. Relishes. Regular salad dressings. Salsa. Potato and tortilla chips. Corn chips and puffs. Salted popcorn and pretzels. Canned or dried soups. Pizza. Frozen entrees and pot pies. Summary  Eating less sodium can help lower your blood pressure, reduce swelling, and protect your heart, liver, and kidneys.  Most people on this plan should limit their sodium intake to 1,500-2,000 mg (milligrams) of sodium each day.  Canned, boxed, and frozen foods are high in sodium. Restaurant foods, fast foods, and pizza are also very high in sodium. You also get sodium by adding salt to food.  Try to cook at home, eat more fresh fruits and vegetables, and eat less fast food, canned, processed, or prepared foods. This information is not intended to replace advice given to you by your health care provider. Make sure you discuss any questions you have with your health care provider. Document Released: 02/27/2002 Document Revised: 08/20/2017 Document Reviewed: 08/31/2016 Elsevier Patient Education  2020 [employer].

## 2019-05-18 ENCOUNTER — Other Ambulatory Visit: Payer: Self-pay | Admitting: Family Medicine

## 2019-05-18 DIAGNOSIS — I10 Essential (primary) hypertension: Secondary | ICD-10-CM

## 2019-05-24 DIAGNOSIS — H5213 Myopia, bilateral: Secondary | ICD-10-CM | POA: Diagnosis not present

## 2019-05-24 DIAGNOSIS — H401131 Primary open-angle glaucoma, bilateral, mild stage: Secondary | ICD-10-CM | POA: Diagnosis not present

## 2019-05-24 DIAGNOSIS — H2513 Age-related nuclear cataract, bilateral: Secondary | ICD-10-CM | POA: Diagnosis not present

## 2019-05-30 ENCOUNTER — Other Ambulatory Visit: Payer: Self-pay | Admitting: Family Medicine

## 2019-05-30 DIAGNOSIS — E876 Hypokalemia: Secondary | ICD-10-CM

## 2019-06-05 ENCOUNTER — Telehealth: Payer: Self-pay | Admitting: *Deleted

## 2019-06-05 ENCOUNTER — Other Ambulatory Visit: Payer: Medicare Other

## 2019-06-05 ENCOUNTER — Other Ambulatory Visit: Payer: Self-pay

## 2019-06-05 NOTE — Telephone Encounter (Signed)
Patient was in to verify machine and I got left arm 140/93 and her machine read 140/90 L arm.

## 2019-06-05 NOTE — Telephone Encounter (Signed)
Noted; monitor is accurate

## 2019-06-06 ENCOUNTER — Other Ambulatory Visit: Payer: Self-pay | Admitting: Family Medicine

## 2019-06-06 DIAGNOSIS — E7800 Pure hypercholesterolemia, unspecified: Secondary | ICD-10-CM

## 2019-06-13 ENCOUNTER — Other Ambulatory Visit: Payer: Self-pay

## 2019-06-13 ENCOUNTER — Ambulatory Visit
Admission: RE | Admit: 2019-06-13 | Discharge: 2019-06-13 | Disposition: A | Discharge status: Home / Self Care | Payer: Medicare Other | Source: Ambulatory Visit | Attending: Family Medicine | Admitting: Family Medicine

## 2019-06-13 DIAGNOSIS — Z1231 Encounter for screening mammogram for malignant neoplasm of breast: Secondary | ICD-10-CM

## 2019-08-03 ENCOUNTER — Telehealth: Payer: Self-pay | Admitting: Family Medicine

## 2019-08-03 DIAGNOSIS — M109 Gout, unspecified: Secondary | ICD-10-CM

## 2019-08-03 MED ORDER — ALLOPURINOL 100 MG PO TABS: 100.0000 mg | ORAL_TABLET | Freq: Every day | ORAL | 0 refills | Status: DC

## 2019-08-03 NOTE — Telephone Encounter (Signed)
Left message for patient to return my call.

## 2019-08-03 NOTE — Telephone Encounter (Signed)
Patient asking for #30 day supply sent to [place].

## 2019-08-03 NOTE — Telephone Encounter (Signed)
I do not see that we ever got a first notice.  Please change to local pharmacy

## 2019-08-03 NOTE — Telephone Encounter (Signed)
Pharmacy sent refill request for allopurinol 100 mg tab , please send to [place], [place]

## 2019-08-03 NOTE — Telephone Encounter (Signed)
2nd notince come in states that pharmacy is out of Allopurinol and that the pt is requested alternative, unless u are ok with sending to a local pharmacy we note, and they state they will transfer out, per [facility],

## 2019-08-29 ENCOUNTER — Other Ambulatory Visit: Payer: Self-pay | Admitting: Family Medicine

## 2019-08-29 DIAGNOSIS — E7800 Pure hypercholesterolemia, unspecified: Secondary | ICD-10-CM

## 2019-09-07 ENCOUNTER — Other Ambulatory Visit: Payer: Self-pay | Admitting: Family Medicine

## 2019-09-07 DIAGNOSIS — M109 Gout, unspecified: Secondary | ICD-10-CM

## 2019-09-07 NOTE — Telephone Encounter (Signed)
Called pt to find out and no answer and lvm for her to find out. [place]

## 2019-09-07 NOTE — Telephone Encounter (Signed)
This was done locally bc [facility] didn't have the prescription.  This isn't supposed to necessarily continue to come from [facility]. Unsure if [facility] filled for pt, or if med is still unavailable.  May need to check with pt regarding this refill

## 2019-09-07 NOTE — Telephone Encounter (Signed)
[facility]. Pt last appt was 05-17-19 and med was last filled 08-03-19. Please advise kh

## 2019-09-11 ENCOUNTER — Other Ambulatory Visit: Payer: Self-pay | Admitting: *Deleted

## 2019-09-14 ENCOUNTER — Other Ambulatory Visit: Payer: Self-pay | Admitting: Family Medicine

## 2019-09-14 DIAGNOSIS — I10 Essential (primary) hypertension: Secondary | ICD-10-CM

## 2019-10-25 ENCOUNTER — Other Ambulatory Visit: Payer: Self-pay | Admitting: Family Medicine

## 2019-10-25 DIAGNOSIS — E7800 Pure hypercholesterolemia, unspecified: Secondary | ICD-10-CM

## 2019-10-25 DIAGNOSIS — E876 Hypokalemia: Secondary | ICD-10-CM

## 2019-10-25 DIAGNOSIS — M109 Gout, unspecified: Secondary | ICD-10-CM

## 2019-10-31 ENCOUNTER — Other Ambulatory Visit: Payer: Self-pay | Admitting: Family Medicine

## 2019-10-31 DIAGNOSIS — E7800 Pure hypercholesterolemia, unspecified: Secondary | ICD-10-CM

## 2019-11-28 DIAGNOSIS — H401131 Primary open-angle glaucoma, bilateral, mild stage: Secondary | ICD-10-CM | POA: Diagnosis not present

## 2019-12-03 NOTE — Patient Instructions (Addendum)
HEALTH MAINTENANCE RECOMMENDATIONS:  It is recommended that you get at least 30 minutes of aerobic exercise at least 5 days/week (for weight loss, you may need as much as 60-90 minutes). This can be any activity that gets your heart rate up. This can be divided in 10-15 minute intervals if needed, but try and build up your endurance at least once a week.  Weight bearing exercise is also recommended twice weekly.  Eat a healthy diet with lots of vegetables, fruits and fiber.  "Colorful" foods have a lot of vitamins (ie green vegetables, tomatoes, red peppers, etc).  Limit sweet tea, regular sodas and alcoholic beverages, all of which has a lot of calories and sugar.  Up to 1 alcoholic drink daily may be beneficial for women (unless trying to lose weight, watch sugars).  Drink a lot of water.  Calcium recommendations are 1200-1500 mg daily (1500 mg for postmenopausal women or women without ovaries), and vitamin D 1000 IU daily.  This should be obtained from diet and/or supplements (vitamins), and calcium should not be taken all at once, but in divided doses.  Monthly self breast exams and yearly mammograms for women over the age of 63 is recommended.  Sunscreen of at least SPF 30 should be used on all sun-exposed parts of the skin when outside between the hours of 10 am and 4 pm (not just when at beach or pool, but even with exercise, golf, tennis, and yard work!)  Use a sunscreen that says "broad spectrum" so it covers both UVA and UVB rays, and make sure to reapply every 1-2 hours.  Remember to change the batteries in your smoke detectors when changing your clock times in the spring and fall. Carbon monoxide detectors are recommended for your home.  Use your seat belt every time you are in a car, and please drive safely and not be distracted with cell phones and texting while driving.   Bailey Parker , Thank you for taking time to come for your Medicare Wellness Visit. I appreciate your ongoing  commitment to your health goals. Please review the following plan we discussed and let me know if I can assist you in the future.    This is a list of the screening recommended for you and due dates:  Health Maintenance  Topic Date Due  . Flu Shot  Never done  . Colon Cancer Screening  04/13/2021  . Mammogram  06/12/2020  . Tetanus Vaccine  09/30/2026  . DEXA scan (bone density measurement)  Completed  .  Hepatitis C: One time screening is recommended by Center for Disease Control  (CDC) for  adults born from [birth year] through 1967.   Completed  . Pneumonia vaccines  Completed   Yearly flu shots ("high dose") are highly recommended, to get in the Fall (September/October).  Continue yearly mammograms.  Please try and quit smoking. STOP BUYING the cigarettes, and work on other relaxation techniques and work on changing the habit. It'll be easier to do if you don't have the cigarettes around.   I recommend getting the new shingles vaccine (Shingrix). You will need to get this from the pharmacy, as it is covered by Medicare Part D.  It is a series of 2 injections, spaced 2 months apart. You need to wait 4 weeks after any other vaccine before starting this series.  (I recommend getting after completing COVID vaccination).  COVID vaccine is recommended.  There are many opportunities to get this--at the [employer] site at the  [place], at [employer] and some [facility] locations, through the health department or [facility] (given at the [facility]). Check [email or web address] or [facility]'s websites for more information.   DHHS also has a lot of resources for scheduling vaccines. COVID-19 Vaccine Information can be found at: ShippingScam.co.uk For questions related to vaccine distribution or appointments, please email vaccine@[email or web address] or call [phone].   Congrats on getting daily exercise on the bike.  Try and increase the time (or do it more  than once in a day) to get a total of 150 minutes each week.  And restart doing the weights on commercial breaks (at least 2-3x/week).

## 2019-12-03 NOTE — Progress Notes (Signed)
Chief Complaint  Patient presents with  . Medicare Wellness    fasting AWV with pap, no concerns.     Bailey Parker is a 69 y.o. female who presents for annual wellness visit and follow-up on chronic medical conditions.   She has the following concerns:  Hypertension follow-up: Blood pressures at home runs [id number]/IOX'B recall diastolic. These are with her home monitor, she reports she brought it and was verified as accurate. Denies dizziness, headaches, chest pain, edema, muscle cramps. Denies side effects of medications.Compliant with medications.  H/o hypokalemia--related to diuretic use. She is using 40 mEqon Mon/Wed/Fri, 20 mEq the other days. She denies muscle cramps. Reports compliance with the liquid potassium supplement. Lab Results  Component Value Date   K 3.5 05/16/2019   Hyperlipidemia follow-up: Patient is reportedly following a low-fat, low cholesterol diet. Compliant with medications (simvastatin 80m) and denies medication side effects. Lipids were at goal on last check. Lab Results  Component Value Date   CHOL 117 05/16/2019   HDL 45 05/16/2019   LDLCALC 58 05/16/2019   TRIG 68 05/16/2019   CHOLHDL 2.6 05/16/2019   Gout: Denies any flares. Compliant withlow dose allopurinol.Denies any flares. Lab Results  Component Value Date   LABURIC 3.8 05/16/2019   Smoking:Admits to still smoking 3-5 cigarettes/day."It is just a habit".  It is usually after a meal. She finds that it helps her with her stress, helps her relax. She has not been directly affected by COVID, but has a lot of compassion for those that have been.  H/o pre-diabetes: She continues to watch her diet (limit sweets, portions).Last check of A1c was normal. Last abnormal was in 2016, highest was A1c 5.8. Fasting sugars have been normal. Lab Results  Component Value Date   HGBA1C 5.6 11/02/2018     Immunization History  Administered Date(s) Administered  . Pneumococcal Conjugate-13  01/22/2014  . Pneumococcal Polysaccharide-23 06/19/2015  . Td 08/03/1995, 09/30/2016  . Tdap 02/02/2006   refuses flu shots Last Pap smear:05/2015--normal; no high risk HPV detected Last mammogram: 05/2019 Last colonoscopy: 03/2016; polyps. Due again 5 years Last DEXA: 05/2017 T-1.1 (stable from prior DEXA elsewhere).  Dentist:2x/year, cleanings 4x/year Ophtho: twice yearly  Exercise: Exercise bike daily 15 minutes (started 2 months ago). Sporadic hand weights on commercial breaks (but not recently).  Other doctors caring for patient include: [facility] Dentist: Dr. TRadford PaxGI: Dr. MEarlean ShawlPodiatrist ([facility]  Depression screen: negative Fall screen: negative Functional Status survey: unremarkable Mini-Cog screen normal. Clock drawing was actually incorrect--switched the long and short arms on the time. See epic for full questionnaires.  End of Life Discussion: Patient hasa living will and medical power of attorney  PMH, PSH, SH and FH were reviewed and updated.  Outpatient Encounter Medications as of 12/04/2019  Medication Sig  . allopurinol (ZYLOPRIM) 100 MG tablet TAKE 1 TABLET BY MOUTH  DAILY  . Calcium Carbonate-Vitamin D (CALCIUM 600+D) 600-400 MG-UNIT per tablet Take 1 tablet by mouth daily.  . cholecalciferol (VITAMIN D) 1000 UNITS tablet Take 1,000 Units by mouth daily.  . dorzolamide-timolol (COSOPT) 22.3-6.8 MG/ML ophthalmic solution Place 1 drop into both eyes 2 (two) times daily.  . indapamide (LOZOL) 2.5 MG tablet TAKE 1 TABLET BY MOUTH IN  THE MORNING  . potassium chloride 20 MEQ/15ML (10%) SOLN DILUTE 30ML IN 4OZ OF WATER OR JUICE AND DRINK THREE  TIMES WEEKLY ON MON, WED,  AND FRIDAY AND 15ML 4 TIMES A WEEK ON ALL  OTHER DAYS  . simvastatin (ZOCOR) 40 MG tablet TAKE 1 TABLET BY MOUTH IN  THE EVENING  . [DISCONTINUED] aspirin 81 MG tablet Take 1 tablet (81 mg total) by mouth daily.   No facility-administered encounter medications  on file as of 12/04/2019.   No Known Allergies  ROS: The patient denies anorexia, fever, weight changes, headaches, vision changes, decreased hearing, ear pain, sore throat, breast concerns, chest pain, palpitations, dizziness, syncope, dyspnea on exertion, cough, swelling, nausea, vomiting, diarrhea, constipation, abdominal pain, melena, hematochezia, indigestion/heartburn, hematuria, incontinence, dysuria, vaginal bleeding, discharge, odor or itch, genital lesions, joint pains, numbness, tingling, weakness, tremor, suspicious skin lesions, depression, anxiety, abnormal bleeding/bruising, or enlarged lymph nodes.   PHYSICAL EXAM:  BP 138/74   Pulse 72   Temp (!) 97.1 F (36.2 C) (Other (Comment))   Ht 5' (1.524 m)   Wt 129 lb (58.5 kg)   BMI 25.19 kg/m   Wt Readings from Last 3 Encounters:  12/04/19 129 lb (58.5 kg)  05/17/19 131 lb 9.6 oz (59.7 kg)  05/16/19 131 lb 9.6 oz (59.7 kg)    General Appearance:  Alert, cooperative, no distress, appears stated age   Head:  Normocephalic, without obvious abnormality, atraumatic   Eyes:  PERRL, conjunctiva/corneas clear, EOM's intact, fundi benign   Ears:  Normal TM's and external ear canals   Nose:  Not examined, wearing mask due to COVID-19 pandemic  Throat:  Not examined, wearing mask due to COVID-19 pandemic  Neck:  Supple, no lymphadenopathy; thyroid: no enlargement/ tenderness/nodules; no carotid bruit or JVD. Slightly prominent submandibular glands noted, symmetric, unchanged  Back:  Spine nontender, no curvature, ROM normal, no CVA tenderness.   Lungs:  Clear to auscultation bilaterally without wheezes, rales or ronchi; respirations unlabored   Chest Wall:  No tenderness or deformity   Heart:  Regular rate and rhythm, S1 and S2 normal, no murmur, rub or gallop   Breast Exam:  No tenderness, masses, or nipple discharge or inversion. No axillary lymphadenopathy. Large, pendulous breasts   Abdomen:  Soft,  non-tender, nondistended, normoactive bowel sounds, no masses, no hepatosplenomegaly. Diastasis recti  Genitalia:  Normal external genitalia without lesions. BUS and vagina normal; no cervical motion tenderness or lesions. No abnormal vaginal discharge. Uterus and adnexa not enlarged, nontender, no masses. Papperformed, somewhat stenotic os.  Rectal:  Normal tone, no masses or tenderness; guaiac negative stool   Extremities:  No clubbing, cyanosis or edema   Pulses:  2+ and symmetric all extremities   Skin:  Skin color, texture, turgor normal, no rashes or lesions   Lymph nodes:  Cervical, supraclavicular, and axillary nodes normal   Neurologic:  CNII-XII intact, normal strength, sensation and gait; reflexes 2+ and symmetric throughout   Psych:  Normal mood, affect, hygiene and grooming    ASSESSMENT/PLAN:   Medicare annual wellness visit, subsequent  Essential hypertension, benign - controlled - Plan: Comprehensive metabolic panel  Pure hypercholesterolemia - at goal per last check; cont current meds  Hypokalemia - Plan: Comprehensive metabolic panel  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  Osteopenia, unspecified location - counseled re: Ca, D, weight-bearing exercise  Prediabetes - counseled on diet, exercise, weight loss  Tobacco use disorder - counseled re: risks, and smoking cessation. Needs to break the habit, not quite ready - Plan: Comprehensive metabolic panel  Medication monitoring encounter - Plan: CBC with Differential/Platelet, Comprehensive metabolic panel  Screening for cervical cancer - pap performed, no further pap testing  needed if it is normal. - Plan: Cytology - PAP([facility])   c-met, CBC Lipids at goal on last check A1cs have been normal, so can just check fasting sugar on chem panel.  lozol RF should be due soon--she thinks she just got it. Doesn't want it refilled  today. Doesn't need others (should be good through early May).  Discussed monthly self breast exams and yearly mammograms; at least 30 minutes of aerobic activity at least 5 days/week and weight-bearing exercise 2x/week; proper sunscreen use reviewed; healthy diet, including goals of calcium and vitamin D intake and alcohol recommendations (less than or equal to 1 drink/day) reviewed, smoking cessation; regular seatbelt use; changing batteries in smoke detectors. Immunization recommendations discussed. High dose flu shots are recommended yearly--she declines.RecommendedShingrix, side effects reviewed--needs to get from pharmacy.COVID vaccine recommended, SE reviewed, and how/where to schedule appt. Colonoscopy recommendations reviewed, UTD, due again 03/2021. Pap smear performed, if normal, will not need further paps.  MOST form reviewed, Full Code, Full Care  F/u 6 mos (will need lipid and uric acid, poss c-met done then; wants to come fasting)  Medicare Attestation I have personally reviewed: The patient's medical and social history Their use of alcohol, tobacco or illicit drugs Their current medications and supplements The patient's functional ability including ADLs,fall risks, home safety risks, cognitive, and hearing and visual impairment Diet and physical activities Evidence for depression or mood disorders  The patient's weight, height, BMI have been recorded in the chart.  I have made referrals, counseling, and provided education to the patient based on review of the above and I have provided the patient with a written personalized care plan for preventive services.

## 2019-12-04 ENCOUNTER — Other Ambulatory Visit (HOSPITAL_COMMUNITY)
Admission: RE | Admit: 2019-12-04 | Discharge: 2019-12-04 | Disposition: A | Discharge status: Home / Self Care | Payer: Medicare Other | Source: Ambulatory Visit | Attending: Family Medicine | Admitting: Family Medicine

## 2019-12-04 ENCOUNTER — Ambulatory Visit (INDEPENDENT_AMBULATORY_CARE_PROVIDER_SITE_OTHER): Payer: Medicare Other | Admitting: Family Medicine

## 2019-12-04 ENCOUNTER — Encounter: Payer: Self-pay | Admitting: Family Medicine

## 2019-12-04 ENCOUNTER — Other Ambulatory Visit: Payer: Self-pay

## 2019-12-04 VITALS — BP 138/74 | HR 72 | Temp 97.1°F | Ht 60.0 in | Wt 129.0 lb

## 2019-12-04 DIAGNOSIS — Z1151 Encounter for screening for human papillomavirus (HPV): Secondary | ICD-10-CM | POA: Diagnosis not present

## 2019-12-04 DIAGNOSIS — E7800 Pure hypercholesterolemia, unspecified: Secondary | ICD-10-CM | POA: Diagnosis not present

## 2019-12-04 DIAGNOSIS — Z5181 Encounter for therapeutic drug level monitoring: Secondary | ICD-10-CM | POA: Diagnosis not present

## 2019-12-04 DIAGNOSIS — M8580 Other specified disorders of bone density and structure, unspecified site: Secondary | ICD-10-CM | POA: Diagnosis not present

## 2019-12-04 DIAGNOSIS — I10 Essential (primary) hypertension: Secondary | ICD-10-CM | POA: Diagnosis not present

## 2019-12-04 DIAGNOSIS — Z124 Encounter for screening for malignant neoplasm of cervix: Secondary | ICD-10-CM | POA: Diagnosis not present

## 2019-12-04 DIAGNOSIS — R7303 Prediabetes: Secondary | ICD-10-CM

## 2019-12-04 DIAGNOSIS — F17200 Nicotine dependence, unspecified, uncomplicated: Secondary | ICD-10-CM

## 2019-12-04 DIAGNOSIS — E876 Hypokalemia: Secondary | ICD-10-CM | POA: Diagnosis not present

## 2019-12-04 DIAGNOSIS — Z780 Asymptomatic menopausal state: Secondary | ICD-10-CM | POA: Insufficient documentation

## 2019-12-04 DIAGNOSIS — Z0000 Encounter for general adult medical examination without abnormal findings: Secondary | ICD-10-CM | POA: Diagnosis not present

## 2019-12-04 DIAGNOSIS — M109 Gout, unspecified: Secondary | ICD-10-CM | POA: Diagnosis not present

## 2019-12-04 LAB — COMPREHENSIVE METABOLIC PANEL
ALT: 8 IU/L (ref 0–32)
AST: 21 IU/L (ref 0–40)
Albumin/Globulin Ratio: 1.5 (ref 1.2–2.2)
Albumin: 4.5 g/dL (ref 3.8–4.8)
Alkaline Phosphatase: 92 IU/L (ref 39–117)
BUN/Creatinine Ratio: 13 (ref 12–28)
BUN: 10 mg/dL (ref 8–27)
Bilirubin Total: 0.3 mg/dL (ref 0.0–1.2)
CO2: 25 mmol/L (ref 20–29)
Calcium: 10.1 mg/dL (ref 8.7–10.3)
Chloride: 100 mmol/L (ref 96–106)
Creatinine, Ser: 0.79 mg/dL (ref 0.57–1.00)
GFR calc Af Amer: 88 mL/min/{1.73_m2} (ref >59)
GFR calc non Af Amer: 76 mL/min/{1.73_m2} (ref >59)
Globulin, Total: 3.1 g/dL (ref 1.5–4.5)
Glucose: 94 mg/dL (ref 65–99)
Potassium: 3.4 mmol/L — ABNORMAL LOW (ref 3.5–5.2)
Sodium: 142 mmol/L (ref 134–144)
Total Protein: 7.6 g/dL (ref 6.0–8.5)

## 2019-12-04 LAB — CBC WITH DIFFERENTIAL/PLATELET
Basophils Absolute: 0 10*3/uL (ref 0.0–0.2)
Basos: 1 %
EOS (ABSOLUTE): 0.1 10*3/uL (ref 0.0–0.4)
Eos: 2 %
Hematocrit: 39.2 % (ref 34.0–46.6)
Hemoglobin: 13 g/dL (ref 11.1–15.9)
Immature Grans (Abs): 0 10*3/uL (ref 0.0–0.1)
Immature Granulocytes: 0 %
Lymphocytes Absolute: 1.6 10*3/uL (ref 0.7–3.1)
Lymphs: 26 %
MCH: 31.6 pg (ref 26.6–33.0)
MCHC: 33.2 g/dL (ref 31.5–35.7)
MCV: 95 fL (ref 79–97)
Monocytes Absolute: 0.5 10*3/uL (ref 0.1–0.9)
Monocytes: 7 %
Neutrophils Absolute: 4 10*3/uL (ref 1.4–7.0)
Neutrophils: 64 %
Platelets: 259 10*3/uL (ref 150–450)
RBC: 4.11 x10E6/uL (ref 3.77–5.28)
RDW: 12.2 % (ref 11.7–15.4)
WBC: 6.2 10*3/uL (ref 3.4–10.8)

## 2019-12-07 LAB — CYTOLOGY - PAP
Comment: NEGATIVE
Diagnosis: NEGATIVE
High risk HPV: NEGATIVE

## 2020-01-02 DIAGNOSIS — Z23 Encounter for immunization: Secondary | ICD-10-CM | POA: Diagnosis not present

## 2020-01-10 ENCOUNTER — Other Ambulatory Visit: Payer: Self-pay | Admitting: Family Medicine

## 2020-01-10 DIAGNOSIS — I10 Essential (primary) hypertension: Secondary | ICD-10-CM

## 2020-01-14 ENCOUNTER — Other Ambulatory Visit: Payer: Self-pay | Admitting: Family Medicine

## 2020-01-14 DIAGNOSIS — M109 Gout, unspecified: Secondary | ICD-10-CM

## 2020-01-21 ENCOUNTER — Other Ambulatory Visit: Payer: Self-pay | Admitting: Family Medicine

## 2020-01-21 DIAGNOSIS — E876 Hypokalemia: Secondary | ICD-10-CM

## 2020-01-23 DIAGNOSIS — Z23 Encounter for immunization: Secondary | ICD-10-CM | POA: Diagnosis not present

## 2020-03-02 DIAGNOSIS — R070 Pain in throat: Secondary | ICD-10-CM | POA: Diagnosis not present

## 2020-03-02 DIAGNOSIS — R221 Localized swelling, mass and lump, neck: Secondary | ICD-10-CM | POA: Diagnosis not present

## 2020-03-11 ENCOUNTER — Other Ambulatory Visit: Payer: Self-pay | Admitting: Family Medicine

## 2020-03-11 DIAGNOSIS — E7800 Pure hypercholesterolemia, unspecified: Secondary | ICD-10-CM

## 2020-04-23 IMAGING — MG MM DIGITAL SCREENING BILAT W/ TOMO W/ CAD
8 series · 8 of 24 positions shown · non-contrast
Comparison: Previous exam(s).

CLINICAL DATA: Screening.

EXAM:
DIGITAL SCREENING BILATERAL MAMMOGRAM WITH TOMO AND CAD

[L CC synth-2D]
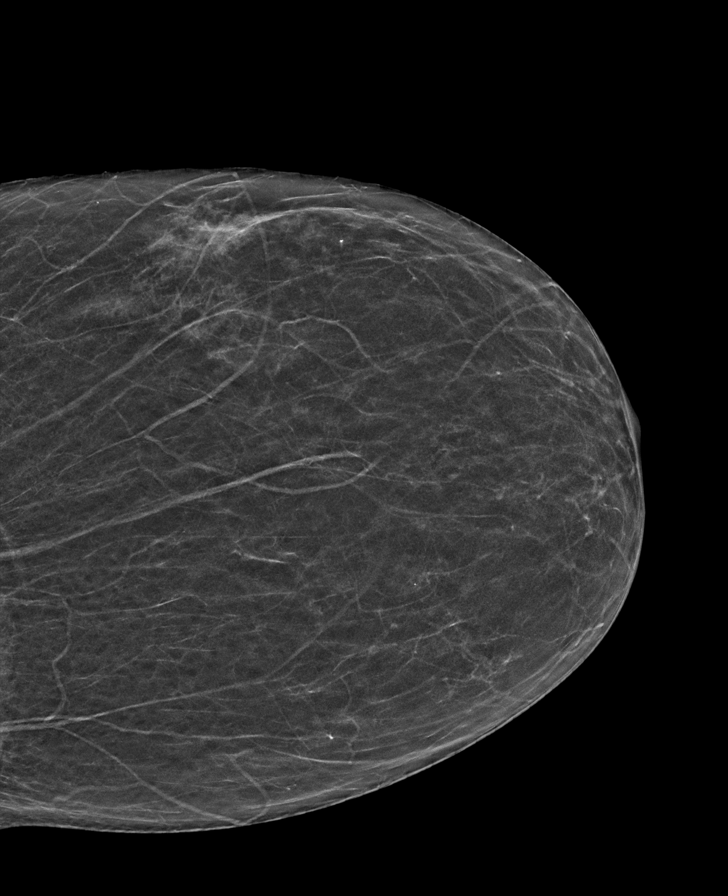

[R MLO synth-2D]
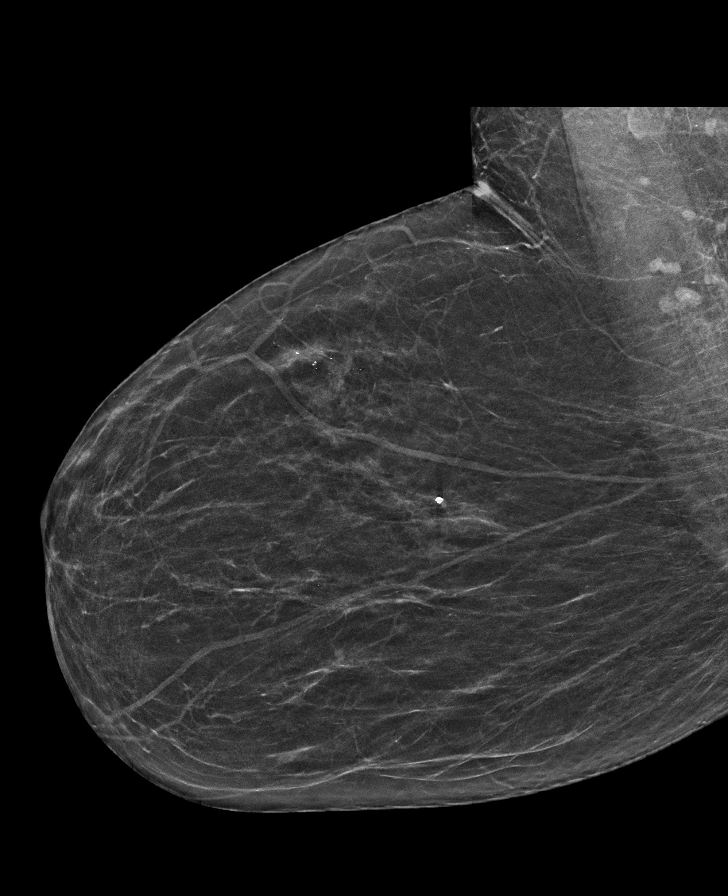

[L MLO synth-2D]
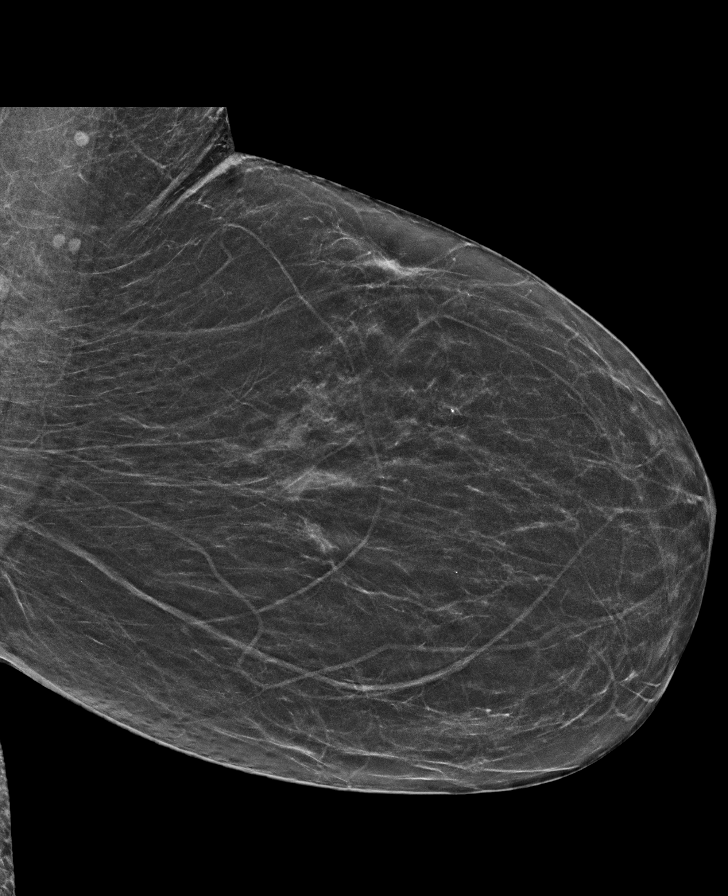

[R CC synth-2D]
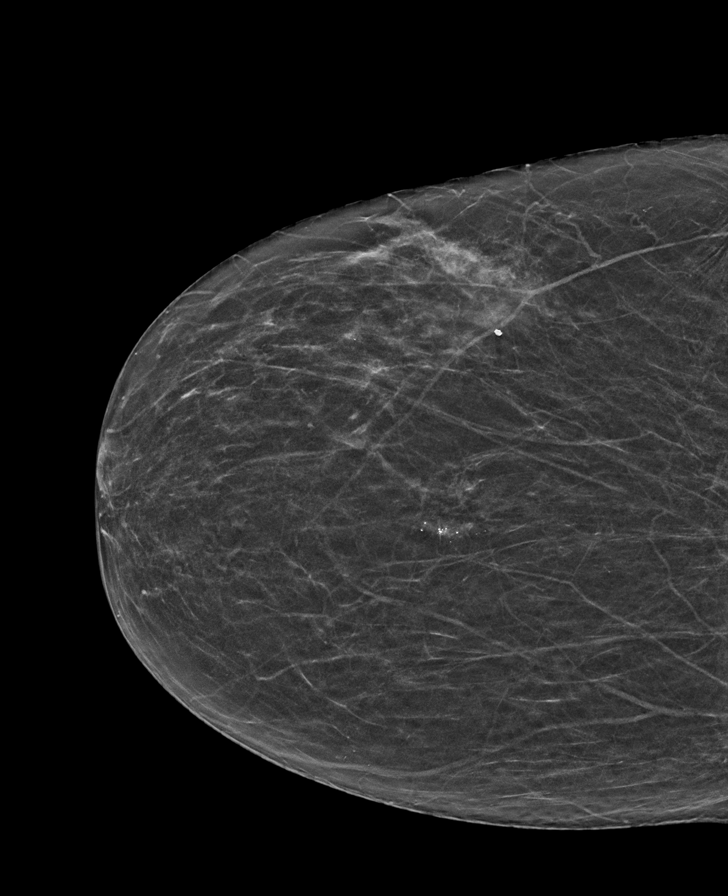

[L MLO tomo · tomo slice 27/54.0]
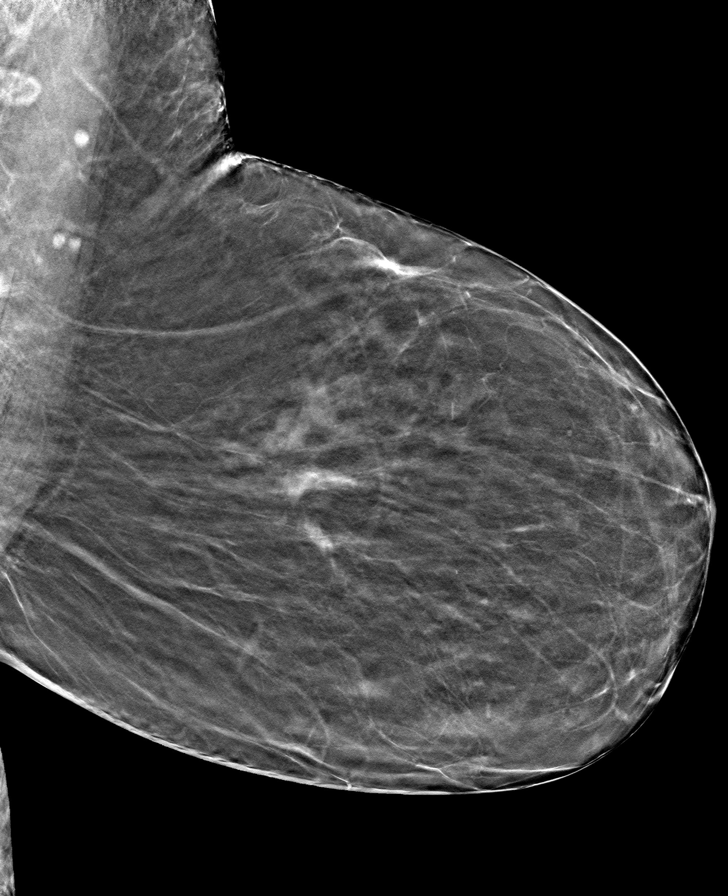

[R CC tomo · tomo slice 25/49.0]
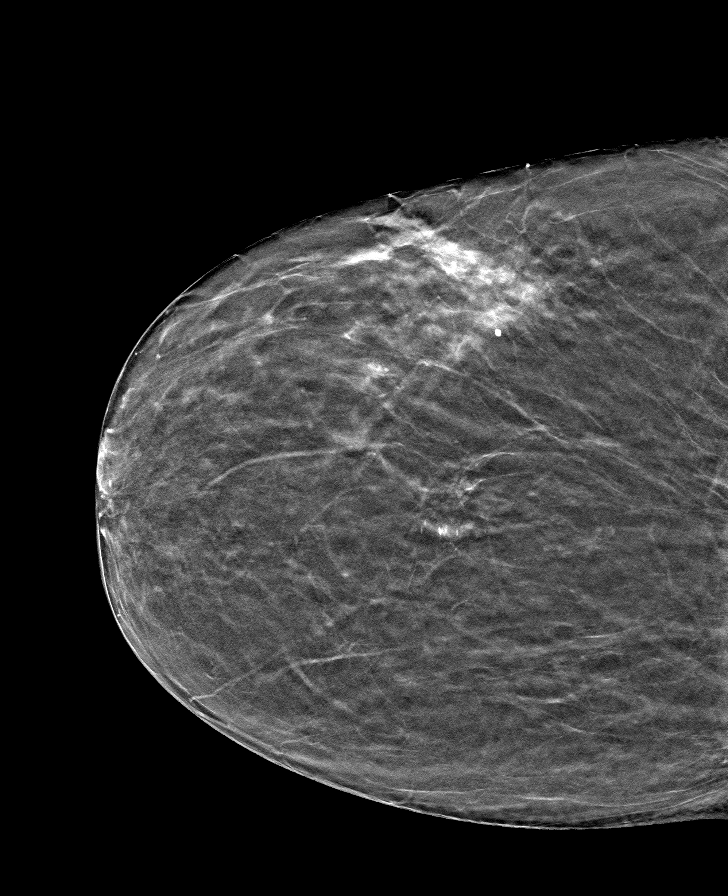

[L CC tomo · tomo slice 25/49.0]
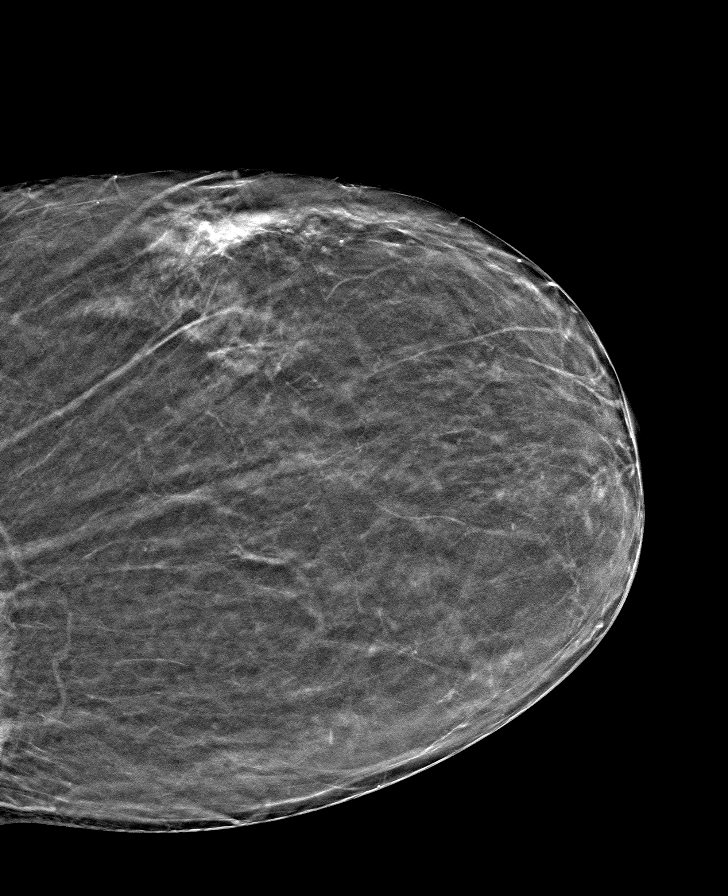

[R MLO tomo · tomo slice 29/56.0]
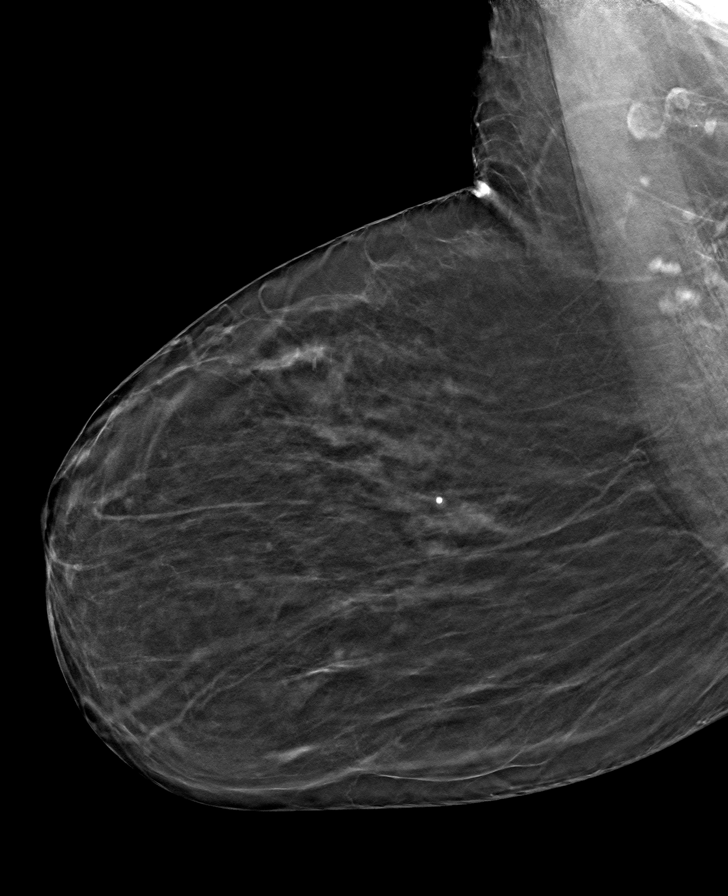

[8 of 24 positions shown; findings below may reference images not displayed]

ACR Breast Density Category b: There are scattered areas of
fibroglandular density.
FINDINGS: There are no findings suspicious for malignancy. Images were
processed with CAD.
IMPRESSION: No mammographic evidence of malignancy. A result letter of this
screening mammogram will be mailed directly to the patient.

RECOMMENDATION:
Screening mammogram in one year. (Code:[id number])

BI-RADS CATEGORY  1: Negative.

## 2020-05-03 ENCOUNTER — Other Ambulatory Visit: Payer: Self-pay | Admitting: Family Medicine

## 2020-05-03 DIAGNOSIS — Z1231 Encounter for screening mammogram for malignant neoplasm of breast: Secondary | ICD-10-CM

## 2020-05-31 DIAGNOSIS — H5213 Myopia, bilateral: Secondary | ICD-10-CM | POA: Diagnosis not present

## 2020-05-31 DIAGNOSIS — H401131 Primary open-angle glaucoma, bilateral, mild stage: Secondary | ICD-10-CM | POA: Diagnosis not present

## 2020-05-31 DIAGNOSIS — H2513 Age-related nuclear cataract, bilateral: Secondary | ICD-10-CM | POA: Diagnosis not present

## 2020-06-09 NOTE — Progress Notes (Signed)
Chief Complaint  Patient presents with  . Hypertension    fasting med check. No new concerns.   . Flu Vaccine    declined.   . Labs Only    blood drawn.   Patient presents for 6 month follow-up.  Chart review--noted visit in 02/2020.  She reports she had a tooth extraction, with some difficulty swallowing afterwards. She went to an urgent care, treated with ABX and symptoms resolved.  Hypertension follow-up: She reports compliance with her medications.  She checks her blood pressure with home monitor (which she states was verified as accurate in the past).  BP's at home are running 130-140 (can't recall diastolic). Denies dizziness, headaches, chest pain, edema, muscle cramps. Denies side effects of medications.  H/o hypokalemia--related to diuretic use. In March, her K+ was slightly low, when using 40 mEq (liquid)on Mon/Wed/Fri, 20 mEq the other days. She denied muscle cramps at that time.  Dose of potassium was increased to 23m 5x/week (Mon through Fri) and 260m on the weekends. Lab Results  Component Value Date   K 3.4 (L) 12/04/2019   Hyperlipidemia follow-up: Patient is reportedly following a low-fat, low cholesterol diet. Compliant with medications (simvastatin 4050mand denies medication side effects. Lipids were at goal on last check a year ago, due for recheck. Lab Results  Component Value Date   CHOL 117 05/16/2019   HDL 45 05/16/2019   LDLCALC 58 05/16/2019   TRIG 68 05/16/2019   CHOLHDL 2.6 05/16/2019   Gout: Denies any flares. Compliant withlow dose allopurinol.Due for recheck of uric acid. Lab Results  Component Value Date   LABURIC 3.8 05/16/2019   Smoking:Admits to still smoking, usually after a meal. She finds that it helps her with her stress, helps her relax. She realized this morning that she really needs to quit. Denies shortness of breath. She has set multiple quit dates in the past.   H/o pre-diabetes: She continues to watch her diet (limit  sweets, portions).Last check of A1c was normal. Last abnormal was in 2016, highest was A1c 5.8. Fasting sugars have been normal, last was 94 in 11/2019. Lab Results  Component Value Date   HGBA1C 5.6 11/02/2018   PMH, PSH, SH reviewed  Immunization History  Administered Date(s) Administered  . PFIZER SARS-COV-2 Vaccination 01/10/2020, 01/24/2020  . Pneumococcal Conjugate-13 01/22/2014  . Pneumococcal Polysaccharide-23 06/19/2015  . Td 08/03/1995, 09/30/2016  . Tdap 02/02/2006   Outpatient Encounter Medications as of 06/10/2020  Medication Sig Note  . allopurinol (ZYLOPRIM) 100 MG tablet TAKE 1 TABLET BY MOUTH  DAILY   . Calcium Carbonate-Vitamin D (CALCIUM 600+D) 600-400 MG-UNIT per tablet Take 1 tablet by mouth daily.   . cholecalciferol (VITAMIN D) 1000 UNITS tablet Take 1,000 Units by mouth daily.   . dorzolamide-timolol (COSOPT) 22.3-6.8 MG/ML ophthalmic solution Place 1 drop into both eyes 2 (two) times daily.   . indapamide (LOZOL) 2.5 MG tablet TAKE 1 TABLET BY MOUTH IN  THE MORNING   . potassium chloride 20 MEQ/15ML (10%) SOLN DILUTE 30ML IN 4OZ OF WATER OR JUICE AND DRINK 3 TIMES  WEEKLY ON MON,WED,AND  FRIDAY,AND 15ML 4 TIMES A  WEEK ON ALL OTHER DAYS 06/10/2020: Currently taking 40 mEq M-F, and 20 mEq on S/S.  . simvastatin (ZOCOR) 40 MG tablet TAKE 1 TABLET BY MOUTH IN  THE EVENING    No facility-administered encounter medications on file as of 06/10/2020.   No Known Allergies  ROS:  No fever, chills, URI symptoms, shortness of breath,  chest pain, palpitations, GI or GU complaints.  No edema.  Moods are good.  Reports she rarely leaves the house since COVID. No bleeding, bruising, rash.   PHYSICAL EXAM:  BP 130/80   Pulse 72   Ht 5' (1.524 m)   Wt 130 lb (59 kg)   BMI 25.39 kg/m   Well appearing, pleasant female in good spirits HEENT: PERRL, EOMI, conjunctiva clear, wearing mask Neck: no lymphadenopathy, thyromegaly or carotid bruit Heart: regular rate and  rhythm Lungs: clear bilaterally Back: no spinal or CVA tenderness Abdomen: soft, nontender, no organomegaly or mass Extremities: no edema, normal pulses. Psych: normal mood, affect, hygiene and grooming Neuro: alert and oriented, normal gait  ASSESSMENT/PLAN:  Essential hypertension, benign - controlled. Cont lowfat diet, current meds - Plan: Comprehensive metabolic panel  Pure hypercholesterolemia - cont statin - Plan: Lipid panel  Hypokalemia - due for recheck since dose increased - Plan: Comprehensive metabolic panel  Gouty arthropathy - doing well, continue allopurinol - Plan: Uric acid  Tobacco use disorder - counseled in detail.  Encouraged her to stop buying cigarettes, discussed finding plan for what to do in place of smoking after meals (walk, gum, etc)  Medication monitoring encounter - Plan: Uric acid, Lipid panel, Comprehensive metabolic panel  Vaccine counseling - discussed flu vaccines (declines) and Shingrix today. Briefly also discussed COVID boosters.   Lipids, uric acid, c-met  Simvastatin refill should be due--pt states she still has plenty. lozol and allopurinol should have another month. Declines needing any meds currently.  Follow-up in 6 months for AWV/med check

## 2020-06-09 NOTE — Patient Instructions (Addendum)
Please try and quit smoking--start thinking about why/when you smoke (habit, boredom, stress) in order to come up with effective strategies to cut back or quit. Available resources to help you quit include free counseling through [facility] ([facility]uit[facility] or 1-800-QUITNOW), smoking cessation classes through [facility] (call to find out schedule), over-the-counter nicotine replacements, and e-cigarettes (although this may not help break the hand-mouth habit).  Many insurance companies also have smoking cessation programs (which may decrease the cost of patches, meds if enrolled).  If these methods are not effective for you, and you are motivated to quit, return to discuss the possibility of prescription medications.  Start by NOT BUYING THEM, and having them so easily accessible.  I recommend getting the new shingles vaccine (Shingrix). You will need to get this from the pharmacy (it is covered by Medicare Part D, not covered for [place] to give in the office).  It is a series of 2 injections, spaced 2 months apart.  This should be given 4 weeks separate from other vaccines.

## 2020-06-10 ENCOUNTER — Ambulatory Visit (INDEPENDENT_AMBULATORY_CARE_PROVIDER_SITE_OTHER): Payer: Medicare Other | Admitting: Family Medicine

## 2020-06-10 ENCOUNTER — Encounter: Payer: Self-pay | Admitting: Family Medicine

## 2020-06-10 ENCOUNTER — Other Ambulatory Visit: Payer: Self-pay

## 2020-06-10 ENCOUNTER — Encounter: Payer: Medicare Other | Admitting: Family Medicine

## 2020-06-10 VITALS — BP 130/80 | HR 72 | Ht 60.0 in | Wt 130.0 lb

## 2020-06-10 DIAGNOSIS — Z7185 Encounter for immunization safety counseling: Secondary | ICD-10-CM

## 2020-06-10 DIAGNOSIS — M109 Gout, unspecified: Secondary | ICD-10-CM

## 2020-06-10 DIAGNOSIS — F17200 Nicotine dependence, unspecified, uncomplicated: Secondary | ICD-10-CM

## 2020-06-10 DIAGNOSIS — E7800 Pure hypercholesterolemia, unspecified: Secondary | ICD-10-CM

## 2020-06-10 DIAGNOSIS — Z7189 Other specified counseling: Secondary | ICD-10-CM

## 2020-06-10 DIAGNOSIS — Z5181 Encounter for therapeutic drug level monitoring: Secondary | ICD-10-CM | POA: Diagnosis not present

## 2020-06-10 DIAGNOSIS — I10 Essential (primary) hypertension: Secondary | ICD-10-CM | POA: Diagnosis not present

## 2020-06-10 DIAGNOSIS — E876 Hypokalemia: Secondary | ICD-10-CM | POA: Diagnosis not present

## 2020-06-11 ENCOUNTER — Encounter: Payer: Self-pay | Admitting: Family Medicine

## 2020-06-11 LAB — COMPREHENSIVE METABOLIC PANEL
ALT: 12 IU/L (ref 0–32)
AST: 13 IU/L (ref 0–40)
Albumin/Globulin Ratio: 1.4 (ref 1.2–2.2)
Albumin: 4.3 g/dL (ref 3.7–4.7)
Alkaline Phosphatase: 93 IU/L (ref 44–121)
BUN/Creatinine Ratio: 16 (ref 12–28)
BUN: 11 mg/dL (ref 8–27)
Bilirubin Total: 0.3 mg/dL (ref 0.0–1.2)
CO2: 28 mmol/L (ref 20–29)
Calcium: 10.1 mg/dL (ref 8.7–10.3)
Chloride: 101 mmol/L (ref 96–106)
Creatinine, Ser: 0.7 mg/dL (ref 0.57–1.00)
GFR calc Af Amer: 101 mL/min/{1.73_m2} (ref >59)
GFR calc non Af Amer: 87 mL/min/{1.73_m2} (ref >59)
Globulin, Total: 3 g/dL (ref 1.5–4.5)
Glucose: 86 mg/dL (ref 65–99)
Potassium: 3.7 mmol/L (ref 3.5–5.2)
Sodium: 143 mmol/L (ref 134–144)
Total Protein: 7.3 g/dL (ref 6.0–8.5)

## 2020-06-11 LAB — LIPID PANEL
Chol/HDL Ratio: 2.9 ratio (ref 0.0–4.4)
Cholesterol, Total: 132 mg/dL (ref 100–199)
HDL: 46 mg/dL (ref >39)
LDL Chol Calc (NIH): 71 mg/dL (ref 0–99)
Triglycerides: 72 mg/dL (ref 0–149)
VLDL Cholesterol Cal: 15 mg/dL (ref 5–40)

## 2020-06-11 LAB — URIC ACID: Uric Acid: 4.1 mg/dL (ref 3.1–7.9)

## 2020-06-13 ENCOUNTER — Other Ambulatory Visit: Payer: Self-pay

## 2020-06-13 ENCOUNTER — Ambulatory Visit
Admission: RE | Admit: 2020-06-13 | Discharge: 2020-06-13 | Disposition: A | Discharge status: Home / Self Care | Payer: Medicare Other | Source: Ambulatory Visit | Attending: Family Medicine | Admitting: Family Medicine

## 2020-06-13 DIAGNOSIS — Z1231 Encounter for screening mammogram for malignant neoplasm of breast: Secondary | ICD-10-CM

## 2020-07-01 ENCOUNTER — Other Ambulatory Visit: Payer: Self-pay | Admitting: Family Medicine

## 2020-07-01 DIAGNOSIS — E7800 Pure hypercholesterolemia, unspecified: Secondary | ICD-10-CM

## 2020-07-01 DIAGNOSIS — I10 Essential (primary) hypertension: Secondary | ICD-10-CM

## 2020-07-01 DIAGNOSIS — E876 Hypokalemia: Secondary | ICD-10-CM

## 2020-07-01 DIAGNOSIS — M109 Gout, unspecified: Secondary | ICD-10-CM

## 2020-08-06 DIAGNOSIS — Z23 Encounter for immunization: Secondary | ICD-10-CM | POA: Diagnosis not present

## 2020-12-02 DIAGNOSIS — H401131 Primary open-angle glaucoma, bilateral, mild stage: Secondary | ICD-10-CM | POA: Diagnosis not present

## 2020-12-12 ENCOUNTER — Other Ambulatory Visit: Payer: Self-pay | Admitting: Family Medicine

## 2020-12-12 DIAGNOSIS — I10 Essential (primary) hypertension: Secondary | ICD-10-CM

## 2020-12-12 DIAGNOSIS — E7800 Pure hypercholesterolemia, unspecified: Secondary | ICD-10-CM

## 2020-12-12 DIAGNOSIS — M109 Gout, unspecified: Secondary | ICD-10-CM

## 2021-01-06 NOTE — Progress Notes (Signed)
Chief Complaint  Patient presents with  . Medicare Wellness    Fasting AWV with pelvic. No new concerns. Sees eye doctor regularly. Doesn't want covid booster today.     Bailey Parker is a 70 y.o. female who presents for annual wellness visit and follow-up on chronic medical conditions.    Hypertension follow-up: She reports compliance with her medications.  She checks her blood pressure with home monitor (which she states was verified as accurate in the past). BP's at home are running 130-132/80's per pt's recollection. Denies dizziness, headaches, chest pain, edema, muscle cramps. Denies side effects of medications.  H/o hypokalemia--related to diuretic use. Current dose of potassium is 40 mEq 5x/week (Mon through Fri) and 6mq on the weekends, and last check was normal. Lab Results  Component Value Date   K 3.7 06/10/2020   Hyperlipidemia follow-up: Patient is reportedly following a low-fat, low cholesterol diet. Compliant with medications(simvastatin 466mand denies medication side effects.Lipids were at goal on last check. Lab Results  Component Value Date   CHOL 132 06/10/2020   HDL 46 06/10/2020   LDLCALC 71 06/10/2020   TRIG 72 06/10/2020   CHOLHDL 2.9 06/10/2020    Gout: Denies any flares. Compliant withlow dose allopurinol.Uric acid was at goal on last check. Lab Results  Component Value Date   LABURIC 4.1 06/10/2020    Smoking:Admits to still smoking, usually after a meal. She finds that it helps her with her stress, helpsher relax.Denies shortness of breath. She has set multiple quit dates in the past.  "I think I decided the other day that I really want to quit". She was able to not [facility] H/o pre-diabetes: She continues to watch her diet (limit sweets, portions).Last check of A1c was normal. Last abnormal was in 2016, highest was A1c 5.8. Fasting sugars have been normal, last was 86 in 05/2020. Lab Results   Component Value Date   HGBA1C 5.6 11/02/2018    Immunization History  Administered Date(s) Administered  . PFIZER(Purple Top)SARS-COV-2 Vaccination 01/10/2020, 01/24/2020, 08/06/2020  . Pneumococcal Conjugate-13 01/22/2014  . Pneumococcal Polysaccharide-23 06/19/2015  . Td 08/03/1995, 09/30/2016  . Tdap 02/02/2006   refuses flu shots Last Pap smear:11/2019--normal; no high risk HPV detected Last mammogram: 05/2020 Last colonoscopy: 03/2016; polyps. Due again 5 years Last DEXA: 05/2017 T-1.1 (stable from prior DEXA elsewhere).  Dentist:2x/year, cleanings 4x/year Ophtho: twice yearly  Exercise: Exercise bike 15 minutes, 4x/week . Sporadic hand weights on commercial breaks, 4x/week.  Other doctors caring for patient include: [facility] Dentist: Dr. BuKalman ShanI: Dr. [place] Depression screen: negative Fall screen: negative Functional Status survey: unremarkable (denies any problems with concentrating, remembering or making decisions--"yes" answer noted by nurse, pt states she said "no") Mini-Cog screen 3/5 (only recalled 1/3 words). Additional 3 words given, recalled 2/3. See epic for full questionnaires.  End of Life Discussion: Patient hasa living will and medical power of attorney  PMH, PSH, SH and FH were reviewed and updated.  Outpatient Encounter Medications as of 01/08/2021  Medication Sig Note  . allopurinol (ZYLOPRIM) 100 MG tablet TAKE 1 TABLET BY MOUTH  DAILY   . Calcium Carbonate-Vitamin D 600-400 MG-UNIT tablet Take 1 tablet by mouth daily.   . cholecalciferol (VITAMIN D) 1000 UNITS tablet Take 1,000 Units by mouth daily.   . dorzolamide-timolol (COSOPT) 22.3-6.8 MG/ML ophthalmic solution Place 1 drop into both eyes 2 (two) times daily.   . indapamide (  LOZOL) 2.5 MG tablet TAKE 1 TABLET BY MOUTH IN  THE MORNING   . potassium chloride 20 MEQ/15ML (10%) SOLN DILUTE 30ML IN 4OZ OF WATER OR JUICE AND DRINK 3 TIMES   WEEKLY ON MON,WED,AND  FRIDAY,AND 15ML 4 TIMES A  WEEK ON ALL OTHER DAYS 01/08/2021: Taking 40 mEq Monday through Friday, and 20 mEq on the weekends  . simvastatin (ZOCOR) 40 MG tablet TAKE 1 TABLET BY MOUTH IN  THE EVENING    No facility-administered encounter medications on file as of 01/08/2021.   No Known Allergies  ROS: The patient denies anorexia, fever, weight changes, headaches, vision changes, decreased hearing, ear pain, sore throat, breast concerns, chest pain, palpitations, dizziness, syncope, dyspnea on exertion, cough, swelling, nausea, vomiting, diarrhea, constipation, abdominal pain, melena, hematochezia, indigestion/heartburn, hematuria, incontinence, dysuria, vaginal bleeding, discharge, odor or itch, genital lesions, joint pains, numbness, tingling, weakness, tremor, suspicious skin lesions, depression, anxiety, abnormal bleeding/bruising, or enlarged lymph nodes.   PHYSICAL EXAM:  BP 128/84   Pulse 64   Ht 5' (1.524 m)   Wt 127 lb 12.8 oz (58 kg)   BMI 24.96 kg/m   Wt Readings from Last 3 Encounters:  01/08/21 127 lb 12.8 oz (58 kg)  06/10/20 130 lb (59 kg)  12/04/19 129 lb (58.5 kg)    General Appearance:  Alert, cooperative, no distress, appears stated age   Head:  Normocephalic, without obvious abnormality, atraumatic   Eyes:  PERRL, conjunctiva/corneas clear, EOM's intact, fundi benign   Ears:  Normal TM's and external ear canals   Nose:  Not examined, wearing mask due to COVID-19 pandemic  Throat:  Not examined, wearing mask due to COVID-19 pandemic  Neck:  Supple, no lymphadenopathy; thyroid: no enlargement/ tenderness/nodules; no carotid bruit or JVD. Slightly prominent submandibular glands noted, symmetric, unchanged  Back:  Spine nontender, no curvature, ROM normal, no CVA tenderness.   Lungs:  Clear to auscultation bilaterally without wheezes, rales or ronchi; respirations unlabored   Chest Wall:  No tenderness or deformity   Heart:   Regular rate and rhythm, S1 and S2 normal, no murmur, rub or gallop   Breast Exam:  No tenderness, masses, or nipple discharge or inversion. No axillary lymphadenopathy. Large, pendulous breasts   Abdomen:  Soft, non-tender, nondistended, normoactive bowel sounds, no masses, no hepatosplenomegaly. Diastasis recti (superiorly)  Genitalia:  Normal external genitalia without lesions. BUS and vagina normal; no cervical motion tenderness. No abnormal vaginal discharge. Uterus and adnexa not enlarged, nontender, no masses. Papnot performed.  Rectal:  Normal tone, no masses or tenderness; guaiac negative stool   Extremities:  No clubbing, cyanosis or edema   Pulses:  2+ and symmetric all extremities   Skin:  Skin color, texture, turgor normal, no rashes or lesions   Lymph nodes:  Cervical, supraclavicular, and axillary nodes normal   Neurologic:  Normal strength, sensation and gait; reflexes 2+ and symmetric throughout   Psych:  Normal mood, affect, hygiene and grooming    ASSESSMENT/PLAN:  Medicare annual wellness visit, subsequent  Essential hypertension, benign - controlled, cont current meds - Plan: Comprehensive metabolic panel, indapamide (LOZOL) 2.5 MG tablet  Hypokalemia - cont potassium supplement - Plan: Comprehensive metabolic panel  Gouty arthropathy - continue allopurinol  Tobacco use disorder - counseled extensively, encouraged to quit  Medication monitoring encounter - Plan: Comprehensive metabolic panel, CBC with Differential/Platelet  Pure hypercholesterolemia - continue statin and low cholesterol diet - Plan: simvastatin (ZOCOR) 40 MG tablet  c-met, CBC Lipids at goal  on last check  Some trouble with word recall today (just the 3 words, not in conversation), not previously a problem.  Encouraged exercise, brain exercises, adequate sleep, smoking cessation.  Discussed monthly self breast exams and yearly mammograms; at least 30  minutes of aerobic activity at least 5 days/week and weight-bearing exercise 2x/week; proper sunscreen use reviewed; healthy diet, including goals of calcium and vitamin D intake and alcohol recommendations (less than or equal to 1 drink/day) reviewed, smoking cessation; regular seatbelt use; changing batteries in smoke detectors. Immunization recommendations discussed. High dose flu shots are recommended yearly--she declines. COVID booster recommended, declined today. RecommendedShingrix, side effects reviewed--needs to get from pharmacy. Colonoscopy recommendations reviewed, UTD, due again 03/2021. No further pap smears are indicated  MOST form reviewed, Full Code, Full Care  F/u 6 mos   Medicare Attestation I have personally reviewed: The patient's medical and social history Their use of alcohol, tobacco or illicit drugs Their current medications and supplements The patient's functional ability including ADLs,fall risks, home safety risks, cognitive, and hearing and visual impairment Diet and physical activities Evidence for depression or mood disorders  The patient's weight, height, BMI have been recorded in the chart.  I have made referrals, counseling, and provided education to the patient based on review of the above and I have provided the patient with a written personalized care plan for preventive services.

## 2021-01-06 NOTE — Patient Instructions (Addendum)
HEALTH MAINTENANCE RECOMMENDATIONS:  It is recommended that you get at least 30 minutes of aerobic exercise at least 5 days/week (for weight loss, you may need as much as 60-90 minutes). This can be any activity that gets your heart rate up. This can be divided in 10-15 minute intervals if needed, but try and build up your endurance at least once a week.  Weight bearing exercise is also recommended twice weekly.  Eat a healthy diet with lots of vegetables, fruits and fiber.  "Colorful" foods have a lot of vitamins (ie green vegetables, tomatoes, red peppers, etc).  Limit sweet tea, regular sodas and alcoholic beverages, all of which has a lot of calories and sugar.  Up to 1 alcoholic drink daily may be beneficial for women (unless trying to lose weight, watch sugars).  Drink a lot of water.  Calcium recommendations are 1200-1500 mg daily (1500 mg for postmenopausal women or women without ovaries), and vitamin D 1000 IU daily.  This should be obtained from diet and/or supplements (vitamins), and calcium should not be taken all at once, but in divided doses.  Monthly self breast exams and yearly mammograms for women over the age of 46 is recommended.  Sunscreen of at least SPF 30 should be used on all sun-exposed parts of the skin when outside between the hours of 10 am and 4 pm (not just when at beach or pool, but even with exercise, golf, tennis, and yard work!)  Use a sunscreen that says "broad spectrum" so it covers both UVA and UVB rays, and make sure to reapply every 1-2 hours.  Remember to change the batteries in your smoke detectors when changing your clock times in the spring and fall. Carbon monoxide detectors are recommended for your home.  Use your seat belt every time you are in a car, and please drive safely and not be distracted with cell phones and texting while driving.   Ms. Bailey Parker , Thank you for taking time to come for your Medicare Wellness Visit. I appreciate your ongoing  commitment to your health goals. Please review the following plan we discussed and let me know if I can assist you in the future.    This is a list of the screening recommended for you and due dates:  Health Maintenance  Topic Date Due  . COVID-19 Vaccine (3 - Booster) 07/26/2020  . Colon Cancer Screening  04/13/2021  . Flu Shot  04/21/2021  . Mammogram  06/13/2021  . Tetanus Vaccine  09/30/2026  . DEXA scan (bone density measurement)  Completed  .  Hepatitis C: One time screening is recommended by Center for Disease Control  (CDC) for  adults born from [birth year] through 1967.   Completed  . Pneumonia vaccines  Completed  . HPV Vaccine  Aged Out   Please get yearly flu shots in the Fall (Sept/Oct/November), high-dose (because you're over 65).  I encourage you to get a second COVID booster.  I recommend getting the new shingles vaccine (Shingrix). Since you have Medicare, you will need to get this from the pharmacy, as it is covered by Part D..   This is a series of 2 injections, spaced 2 months apart.  It doesn't have to be exactly 2 months apart (but can't be under 2 months), if that isn't feasible for your schedule, but try and get them close to 2 months (and definitely within 6 months of each other, or else the efficacy of the vaccine drops off).  Please try and quit smoking--start thinking about why/when you smoke (habit, boredom, stress) in order to come up with effective strategies to cut back or quit. Available resources to help you quit include free counseling through [facility] ([place]uit[facility] or 1-800-QUITNOW), smoking cessation classes through [facility] (call to find out schedule), over-the-counter nicotine replacements.  Many insurance companies also have smoking cessation programs (which may decrease the cost of patches, meds if enrolled).   Please set a quit date, and stop buying the cigarettes so they aren't available and tempting.  You should be  hearing from your gastroenterologist for your repeat colonoscopy which is due in July.  If you don't hear from them, give them a call.

## 2021-01-08 ENCOUNTER — Other Ambulatory Visit: Payer: Self-pay

## 2021-01-08 ENCOUNTER — Ambulatory Visit (INDEPENDENT_AMBULATORY_CARE_PROVIDER_SITE_OTHER): Payer: Medicare Other | Admitting: Family Medicine

## 2021-01-08 ENCOUNTER — Encounter: Payer: Self-pay | Admitting: Family Medicine

## 2021-01-08 VITALS — BP 128/84 | HR 64 | Ht 60.0 in | Wt 127.8 lb

## 2021-01-08 DIAGNOSIS — E876 Hypokalemia: Secondary | ICD-10-CM | POA: Diagnosis not present

## 2021-01-08 DIAGNOSIS — Z0000 Encounter for general adult medical examination without abnormal findings: Secondary | ICD-10-CM | POA: Diagnosis not present

## 2021-01-08 DIAGNOSIS — F17200 Nicotine dependence, unspecified, uncomplicated: Secondary | ICD-10-CM

## 2021-01-08 DIAGNOSIS — Z5181 Encounter for therapeutic drug level monitoring: Secondary | ICD-10-CM | POA: Diagnosis not present

## 2021-01-08 DIAGNOSIS — M109 Gout, unspecified: Secondary | ICD-10-CM | POA: Diagnosis not present

## 2021-01-08 DIAGNOSIS — I10 Essential (primary) hypertension: Secondary | ICD-10-CM

## 2021-01-08 DIAGNOSIS — E7800 Pure hypercholesterolemia, unspecified: Secondary | ICD-10-CM | POA: Diagnosis not present

## 2021-01-08 MED ORDER — SIMVASTATIN 40 MG PO TABS: 40.0000 mg | ORAL_TABLET | Freq: Every evening | ORAL | 1 refills | Status: DC

## 2021-01-08 MED ORDER — INDAPAMIDE 2.5 MG PO TABS: 2.5000 mg | ORAL_TABLET | Freq: Every morning | ORAL | 1 refills | Status: DC

## 2021-01-09 ENCOUNTER — Encounter: Payer: Self-pay | Admitting: Family Medicine

## 2021-01-09 LAB — CBC WITH DIFFERENTIAL/PLATELET
Basophils Absolute: 0 10*3/uL (ref 0.0–0.2)
Basos: 1 %
EOS (ABSOLUTE): 0 10*3/uL (ref 0.0–0.4)
Eos: 0 %
Hematocrit: 38.8 % (ref 34.0–46.6)
Hemoglobin: 13.3 g/dL (ref 11.1–15.9)
Immature Grans (Abs): 0 10*3/uL (ref 0.0–0.1)
Immature Granulocytes: 0 %
Lymphocytes Absolute: 1.6 10*3/uL (ref 0.7–3.1)
Lymphs: 26 %
MCH: 32.6 pg (ref 26.6–33.0)
MCHC: 34.3 g/dL (ref 31.5–35.7)
MCV: 95 fL (ref 79–97)
Monocytes Absolute: 0.4 10*3/uL (ref 0.1–0.9)
Monocytes: 7 %
Neutrophils Absolute: 4.2 10*3/uL (ref 1.4–7.0)
Neutrophils: 66 %
Platelets: 227 10*3/uL (ref 150–450)
RBC: 4.08 x10E6/uL (ref 3.77–5.28)
RDW: 12.3 % (ref 11.7–15.4)
WBC: 6.3 10*3/uL (ref 3.4–10.8)

## 2021-01-09 LAB — COMPREHENSIVE METABOLIC PANEL
ALT: 11 IU/L (ref 0–32)
AST: 15 IU/L (ref 0–40)
Albumin/Globulin Ratio: 1.6 (ref 1.2–2.2)
Albumin: 4.6 g/dL (ref 3.7–4.7)
Alkaline Phosphatase: 94 IU/L (ref 44–121)
BUN/Creatinine Ratio: 14 (ref 12–28)
BUN: 11 mg/dL (ref 8–27)
Bilirubin Total: 0.2 mg/dL (ref 0.0–1.2)
CO2: 24 mmol/L (ref 20–29)
Calcium: 9.9 mg/dL (ref 8.7–10.3)
Chloride: 102 mmol/L (ref 96–106)
Creatinine, Ser: 0.79 mg/dL (ref 0.57–1.00)
Globulin, Total: 2.9 g/dL (ref 1.5–4.5)
Glucose: 91 mg/dL (ref 65–99)
Potassium: 3.7 mmol/L (ref 3.5–5.2)
Sodium: 141 mmol/L (ref 134–144)
Total Protein: 7.5 g/dL (ref 6.0–8.5)
eGFR: 80 mL/min/{1.73_m2} (ref >59)

## 2021-01-21 ENCOUNTER — Other Ambulatory Visit: Payer: Self-pay | Admitting: Family Medicine

## 2021-01-21 DIAGNOSIS — M109 Gout, unspecified: Secondary | ICD-10-CM

## 2021-02-05 ENCOUNTER — Encounter: Payer: Self-pay | Admitting: *Deleted

## 2021-02-05 DIAGNOSIS — Z23 Encounter for immunization: Secondary | ICD-10-CM | POA: Diagnosis not present

## 2021-02-27 ENCOUNTER — Other Ambulatory Visit: Payer: Self-pay | Admitting: Family Medicine

## 2021-02-27 DIAGNOSIS — E876 Hypokalemia: Secondary | ICD-10-CM

## 2021-05-06 ENCOUNTER — Other Ambulatory Visit: Payer: Self-pay | Admitting: Family Medicine

## 2021-05-06 DIAGNOSIS — Z1231 Encounter for screening mammogram for malignant neoplasm of breast: Secondary | ICD-10-CM

## 2021-06-16 ENCOUNTER — Other Ambulatory Visit: Payer: Self-pay | Admitting: Family Medicine

## 2021-06-16 DIAGNOSIS — M109 Gout, unspecified: Secondary | ICD-10-CM

## 2021-06-16 DIAGNOSIS — I10 Essential (primary) hypertension: Secondary | ICD-10-CM

## 2021-06-16 DIAGNOSIS — E7800 Pure hypercholesterolemia, unspecified: Secondary | ICD-10-CM

## 2021-06-23 ENCOUNTER — Ambulatory Visit
Admission: RE | Admit: 2021-06-23 | Discharge: 2021-06-23 | Disposition: A | Discharge status: Home / Self Care | Payer: Medicare Other | Source: Ambulatory Visit | Attending: Family Medicine | Admitting: Family Medicine

## 2021-06-23 ENCOUNTER — Other Ambulatory Visit: Payer: Self-pay

## 2021-06-23 DIAGNOSIS — Z1231 Encounter for screening mammogram for malignant neoplasm of breast: Secondary | ICD-10-CM

## 2021-06-25 DIAGNOSIS — H401131 Primary open-angle glaucoma, bilateral, mild stage: Secondary | ICD-10-CM | POA: Diagnosis not present

## 2021-06-25 DIAGNOSIS — H5213 Myopia, bilateral: Secondary | ICD-10-CM | POA: Diagnosis not present

## 2021-07-15 NOTE — Progress Notes (Signed)
Chief Complaint  Patient presents with   Hypertension    Fasting med check. No concerns. Wants to think about the booster.     Patient presents for 6 month med check.  Hypertension follow-up: She reports compliance with her medications.  She checks her blood pressure with home monitor (which she states was verified as accurate in the past). BP's at home are running usually 161 systolic, cannot recall the diastolic. Denies dizziness, headaches, chest pain, edema, muscle cramps. Denies side effects of medications.     H/o hypokalemia--related to diuretic use.  Current dose of potassium is supposed to be 40 mEq 5x/week (Mon through Fri) and 61mEq on the weekends, and last check was normal. (She just knows she takes 1 of the small cups provided, and 1/2 on weekends) Lab Results  Component Value Date   K 3.7 01/08/2021     Hyperlipidemia follow-up: Patient is reportedly following a low-fat, low cholesterol diet. Compliant with medications (simvastatin 40mg ) and denies medication side effects. She is due for recheck, previously at goal on this regimen. Lab Results  Component Value Date   CHOL 132 06/10/2020   HDL 46 06/10/2020   LDLCALC 71 06/10/2020   TRIG 72 06/10/2020   CHOLHDL 2.9 06/10/2020    Gout: Denies any flares. Compliant with low dose allopurinol. Uric acid was at goal on last check, due for recheck. Lab Results  Component Value Date   LABURIC 4.1 06/10/2020    Smoking:   "I have decided it is time" to quit. She has not set a quit date. She is still smoking.  Only smoked 3 cigarettes yesterday.  She has never smoked more than 1/2 PPD, and usually less, for 40 years.   H/o pre-diabetes:  She continues to watch her diet (limit sweets, portions). Last check of A1c was normal (5.6 in 10/2018).  Last abnormal was in 2016, highest was A1c 5.8. Fasting sugars have been normal, last was 91 in 12/2020, and was 86 in 05/2020. She does 20 minutes 3-4 times/week on the bicycle.   PMH,  PSH, SH reviewed  Outpatient Encounter Medications as of 07/16/2021  Medication Sig Note   allopurinol (ZYLOPRIM) 100 MG tablet TAKE 1 TABLET BY MOUTH  DAILY    Calcium Carbonate-Vitamin D 600-400 MG-UNIT tablet Take 1 tablet by mouth daily.    cholecalciferol (VITAMIN D) 1000 UNITS tablet Take 1,000 Units by mouth daily.    dorzolamide-timolol (COSOPT) 22.3-6.8 MG/ML ophthalmic solution Place 1 drop into both eyes 2 (two) times daily.    indapamide (LOZOL) 2.5 MG tablet Take 1 tablet (2.5 mg total) by mouth every morning.    potassium chloride 20 MEQ/15ML (10%) SOLN DILUTE 30ML IN 4OZ OF WATER OR JUICE AND DRINK 3 TIMES  WEEKLY ON MON, WED AND FRI  AND 15ML 4 TIMES A WEEK ON  ALL OTHER DAYS 07/16/2021: Uses 1/2 dose on Sat/Sun, "full" on M-F, should be 40 mEq   simvastatin (ZOCOR) 40 MG tablet Take 1 tablet (40 mg total) by mouth every evening.    No facility-administered encounter medications on file as of 07/16/2021.   No Known Allergies   ROS:  No fever, chills, URI symptoms, shortness of breath, chest pain, palpitations, GI or GU complaints.  No edema.  Moods are good.  No bleeding, bruising, rash.     PHYSICAL EXAM:   BP 140/80   Pulse 60   Ht 5' (1.524 m)   Wt 126 lb 12.8 oz (57.5 kg)   BMI  24.76 kg/m   130/84 on repeat by MD  Wt Readings from Last 3 Encounters:  07/16/21 126 lb 12.8 oz (57.5 kg)  01/08/21 127 lb 12.8 oz (58 kg)  06/10/20 130 lb (59 kg)     Well appearing, pleasant female in good spirits HEENT: PERRL, EOMI, conjunctiva clear, wearing mask Neck: no lymphadenopathy, thyromegaly or carotid bruit Heart: regular rate and rhythm Lungs: clear bilaterally Back: no spinal or CVA tenderness Abdomen: soft, nontender, no organomegaly or mass Extremities: no edema, normal pulses.  Psych: normal mood, affect, hygiene and grooming Neuro: alert and oriented, normal gait    ASSESSMENT/PLAN:  Essential hypertension, benign - controlled; cont to monitor at  home, cont indapamide - Plan: indapamide (LOZOL) 2.5 MG tablet  Hypokalemia - cont K+ as prescribed  Gouty arthropathy - cont allopurinol, due for recheck uric acid - Plan: Uric acid, allopurinol (ZYLOPRIM) 100 MG tablet  Tobacco use disorder - cessation encouraged, risks reviewed, counseling provided.  She seems ready  Pure hypercholesterolemia - cont statin and low cholesterol diet - Plan: Lipid panel  Prediabetes - cont low carb, low sugar diet.  Encouraged daily exercise and weight loss at waist/abdomen - Plan: Glucose, random  Medication monitoring encounter - Plan: Lipid panel, Uric acid, Glucose, random  Bivalent COVID booster recommended, she will consider this, possibly return a different time, declines today. Refuses flu shot. Reminded her of recommendation for Shingrix. She had a friend recently get Shingles, so plans to get this.  Discussed smoking, she reports being ready, which is a big step, but doesn't have a plan or quit date.  Discussed plans on how to be prepared (gum, sugar-free lozenges, etc), and to set a quit date. I don't believe she is a candidate for lung CA screening--very light smoker, though for many years.   F/u as scheduled for AWV in April

## 2021-07-15 NOTE — Patient Instructions (Addendum)
Schedule a nurse visit at your convenience for the bivalent COVID booster.  Yearly flu shots are recommended.  I recommend getting the new shingles vaccine (Shingrix). Since you have Medicare, you will need to get this from the pharmacy, as it is covered by Part D. This is a series of 2 injections, spaced 2 months apart.   This should be separated from other vaccines by at least 2 weeks.  Your potassium dose should be 2 tablespoons (9mL or an ounce) on Monday through Friday, 1/2 that amount on Sat/Sun.

## 2021-07-16 ENCOUNTER — Encounter: Payer: Self-pay | Admitting: Family Medicine

## 2021-07-16 ENCOUNTER — Ambulatory Visit (INDEPENDENT_AMBULATORY_CARE_PROVIDER_SITE_OTHER): Payer: Medicare Other | Admitting: Family Medicine

## 2021-07-16 ENCOUNTER — Other Ambulatory Visit: Payer: Self-pay

## 2021-07-16 VITALS — BP 130/84 | HR 60 | Ht 60.0 in | Wt 126.8 lb

## 2021-07-16 DIAGNOSIS — I10 Essential (primary) hypertension: Secondary | ICD-10-CM | POA: Diagnosis not present

## 2021-07-16 DIAGNOSIS — F17200 Nicotine dependence, unspecified, uncomplicated: Secondary | ICD-10-CM

## 2021-07-16 DIAGNOSIS — E876 Hypokalemia: Secondary | ICD-10-CM | POA: Diagnosis not present

## 2021-07-16 DIAGNOSIS — Z5181 Encounter for therapeutic drug level monitoring: Secondary | ICD-10-CM | POA: Diagnosis not present

## 2021-07-16 DIAGNOSIS — R7303 Prediabetes: Secondary | ICD-10-CM | POA: Diagnosis not present

## 2021-07-16 DIAGNOSIS — M109 Gout, unspecified: Secondary | ICD-10-CM | POA: Diagnosis not present

## 2021-07-16 DIAGNOSIS — E7800 Pure hypercholesterolemia, unspecified: Secondary | ICD-10-CM

## 2021-07-16 MED ORDER — INDAPAMIDE 2.5 MG PO TABS: 2.5000 mg | ORAL_TABLET | Freq: Every morning | ORAL | 1 refills | Status: DC

## 2021-07-16 MED ORDER — ALLOPURINOL 100 MG PO TABS: 100.0000 mg | ORAL_TABLET | Freq: Every day | ORAL | 1 refills | Status: DC

## 2021-07-17 LAB — URIC ACID: Uric Acid: 4.1 mg/dL (ref 3.1–7.9)

## 2021-07-17 LAB — LIPID PANEL
Chol/HDL Ratio: 2.9 ratio (ref 0.0–4.4)
Cholesterol, Total: 128 mg/dL (ref 100–199)
HDL: 44 mg/dL (ref >39)
LDL Chol Calc (NIH): 71 mg/dL (ref 0–99)
Triglycerides: 59 mg/dL (ref 0–149)
VLDL Cholesterol Cal: 13 mg/dL (ref 5–40)

## 2021-07-17 LAB — GLUCOSE, RANDOM: Glucose: 92 mg/dL (ref 70–99)

## 2021-07-17 MED ORDER — SIMVASTATIN 40 MG PO TABS: 40.0000 mg | ORAL_TABLET | Freq: Every evening | ORAL | 3 refills | Status: DC

## 2021-07-30 DIAGNOSIS — Z23 Encounter for immunization: Secondary | ICD-10-CM | POA: Diagnosis not present

## 2021-11-19 ENCOUNTER — Other Ambulatory Visit: Payer: Self-pay | Admitting: Family Medicine

## 2021-11-19 DIAGNOSIS — E876 Hypokalemia: Secondary | ICD-10-CM

## 2021-12-21 ENCOUNTER — Other Ambulatory Visit: Payer: Self-pay | Admitting: Family Medicine

## 2021-12-21 DIAGNOSIS — M109 Gout, unspecified: Secondary | ICD-10-CM

## 2021-12-21 DIAGNOSIS — I10 Essential (primary) hypertension: Secondary | ICD-10-CM

## 2021-12-30 DIAGNOSIS — H401131 Primary open-angle glaucoma, bilateral, mild stage: Secondary | ICD-10-CM | POA: Diagnosis not present

## 2022-01-13 NOTE — Progress Notes (Signed)
?Chief Complaint  ?Patient presents with  ? Medicare Wellness  ?  Fasting AWV/CPE. Patient states you said no more pelvic exams. Will find out where her husband is going for GI and will let us know. No concerns.   ? ? ?Bailey Parker is a 71 y.o. female who presents for annual wellness visit and follow-up on chronic medical conditions.   ? ?Hypertension follow-up: She reports compliance with her medications.  She checks her blood pressure with home monitor (which she states was verified as accurate in the past). BP's at home are running 128-130/84 (max 134/84). ?Denies dizziness, headaches, chest pain, edema, muscle cramps. Denies side effects of medications.   ?  ?H/o hypokalemia--related to diuretic use.  Current dose of potassium is supposed to be 40 mEq 5x/week (Mon through Fri) and 31mq on the weekends, and last check was normal. (She just knows she takes 1 of the small cups provided, and 1/2 on weekends). She denies muscle cramps.  Due or recheck. ?Lab Results  ?Component Value Date  ? K 3.7 01/08/2021  ? ?  ?Hyperlipidemia follow-up: Patient is reportedly following a low-fat, low cholesterol diet. Compliant with medications (simvastatin '40mg'$ ) and denies medication side effects. She has been at goal on this regimen: ?Lab Results  ?Component Value Date  ? CHOL 128 07/16/2021  ? HDL 44 07/16/2021  ? [facility] 07/16/2021  ? TRIG 59 07/16/2021  ? CHOLHDL 2.9 07/16/2021  ? ?  ?Gout: Denies any flares. Compliant with low dose allopurinol. Uric acid was at goal on last check. ?Flare was quite severe and prefers not to try stopping the allopurinol.  Hasn't had a flare in many years. ?Lab Results  ?Component Value Date  ? LABURIC 4.1 07/16/2021  ?  ? ?Smoking:  Her daughter is staying with her since having surgery yesterday, has been busy and hasn't had a cigarette today.  Typically smokes 3-5/day. ?In October she had reported: "I have decided it is time" to quit. She still wants to quit, but still has never set a  quit date.  She has never smoked more than 1/2 PPD, and usually less, for 40 years. ?  ?H/o pre-diabetes:  She continues to watch her diet (limit sweets, portions). Last check of A1c was normal (5.6 in 10/2018).  Last abnormal was in 2016, highest was A1c 5.8. Fasting sugars have been normal, last was 92 in 06/2021. ?She is doing her husband's Parkinson's workouts with him (10 minutes daily) ?She continues to do 20-30 minutes 3-4 times/week on the bicycle. ? ? ?Immunization History  ?Administered Date(s) Administered  ? PFIZER(Purple Top)SARS-COV-2 Vaccination 01/10/2020, 01/24/2020, 08/06/2020, 02/05/2021  ? PPension scheme manager160yr& up 07/30/2021  ? Pneumococcal Conjugate-13 01/22/2014  ? Pneumococcal Polysaccharide-23 06/19/2015  ? Td 08/03/1995, 09/30/2016  ? Tdap 02/02/2006  ? Zoster Recombinat (Shingrix) 01/06/2022  ? ?refuses flu shots ?Last Pap smear: 11/2019--normal; no high risk HPV detected  ?Last mammogram: 06/2021 ?Last colonoscopy: 03/2016; polyps. Due again 5 years (03/2021), hasn't had ?Last DEXA: 05/2017 T-1.1 (stable from prior DEXA elsewhere).  ?Dentist: 2x/year, cleanings 4x/year ?Ophtho: twice yearly   ?Exercise: Doing her husband's Parkinson's workouts together (10 minutes daily) ?She does 20-30 minutes 3-4 times/week on the bicycle. She has hand weights at home, hasn't been using them. ?  ?Patient Care Team: ?KnRita OharaMD as PCP - General (Family Medicine) ?MeRichmond CampbellMD as Consulting Physician (Gastroenterology) ?McLuberta MutterMD as Consulting Physician (Ophthalmology) ?Dentist: Dr. BuKalman ShanGI: Dr. MeEarlean Shawl  retired) ?Podiatrist (Reform, no longer sees) ? ?Depression Screening: ?[place] Office Visit from 01/14/2022 in [place]  ?PHQ-2 Total Score 0  ? ?  ?  ? ?Falls screen:  ? ?  01/14/2022  ?  9:43 AM 07/16/2021  ? 10:42 AM 01/08/2021  ?  9:36 AM 06/10/2020  ? 10:51 AM 12/04/2019  ?  9:50 AM  ?Fall Risk   ?Falls in the past year? 0 0 0  0 0  ?Number falls in past yr: 0 0 0    ?Injury with Fall? 0 0 0    ?Risk for fall due to : No Fall Risks No Fall Risks No Fall Risks    ?Follow up Falls evaluation completed Falls evaluation completed Falls evaluation completed    ?  ? ?Functional Status Survey: ?Is the patient deaf or have difficulty hearing?: No ?Does the patient have difficulty seeing, even when wearing glasses/contacts?: No ?Does the patient have difficulty concentrating, remembering, or making decisions?: No ?Does the patient have difficulty walking or climbing stairs?: No ?Does the patient have difficulty dressing or bathing?: No ?Does the patient have difficulty doing errands alone such as visiting a doctor's office or shopping?: No ? ?Mini-Cog Scoring: 5  ? ?  ?End of Life Discussion:  Patient has a living will and medical power of attorney, in chart ? ? ?PMH, PSH, SH and FH were reviewed and updated. ? ?Outpatient Encounter Medications as of 01/14/2022  ?Medication Sig  ? allopurinol (ZYLOPRIM) 100 MG tablet Take 1 tablet (100 mg total) by mouth daily.  ? Calcium Carbonate-Vitamin D 600-400 MG-UNIT tablet Take 1 tablet by mouth daily.  ? cholecalciferol (VITAMIN D) 1000 UNITS tablet Take 1,000 Units by mouth daily.  ? dorzolamide-timolol (COSOPT) 22.3-6.8 MG/ML ophthalmic solution Place 1 drop into both eyes 2 (two) times daily.  ? indapamide (LOZOL) 2.5 MG tablet Take 1 tablet (2.5 mg total) by mouth every morning.  ? potassium chloride 20 MEQ/15ML (10%) SOLN DILUTE 30ML IN 4 OZ WATER  OR JUICE AND DRINK 3 TIMES  WEEKLY MONDAY, WEDNESDAY,  FRIDAY AND 15ML 4 TIMES A  WEEK ALL OTHER DAYS  ? simvastatin (ZOCOR) 40 MG tablet Take 1 tablet (40 mg total) by mouth every evening.  ? ?No facility-administered encounter medications on file as of 01/14/2022.  ? ?No Known Allergies ? ? ?ROS: The patient denies anorexia, fever, weight changes, headaches, vision changes, decreased hearing, ear pain, sore throat, breast concerns, chest pain, palpitations,  dizziness, syncope, dyspnea on exertion, cough, swelling, nausea, vomiting, diarrhea, constipation, abdominal pain, melena, hematochezia, indigestion/heartburn, hematuria, incontinence, dysuria, vaginal bleeding, discharge, odor or itch, genital lesions, joint pains, numbness, tingling, weakness, tremor, suspicious skin lesions, depression, anxiety, abnormal bleeding/bruising, or enlarged lymph nodes. ? ? ?PHYSICAL EXAM: ? ?BP 120/78   Pulse 64   Ht 5' (1.524 m)   Wt 125 lb 9.6 oz (57 kg)   BMI 24.53 kg/m?  ? ?Wt Readings from Last 3 Encounters:  ?01/14/22 125 lb 9.6 oz (57 kg)  ?07/16/21 126 lb 12.8 oz (57.5 kg)  ?01/08/21 127 lb 12.8 oz (58 kg)  ? ? ?General Appearance:   Alert, cooperative, no distress, appears stated age    ?Head:   Normocephalic, without obvious abnormality, atraumatic    ?Eyes:   PERRL, conjunctiva/corneas clear, EOM's intact, fundi benign    ?Ears:   Normal TM's and external ear canals    ?Nose:   Normal, no drainage  ?Throat:   Normal,  no lesions  ?Neck:   Supple, no lymphadenopathy; thyroid: no enlargement/ tenderness/nodules; no carotid bruit or JVD. Slightly prominent submandibular glands noted, symmetric, unchanged  ?Back:   Spine nontender, no curvature, ROM normal, no CVA tenderness.   ?Lungs:   Clear to auscultation bilaterally without wheezes, rales or ronchi; respirations unlabored    ?Chest Wall:   No tenderness or deformity   ?Heart:   Regular rate and rhythm, S1 and S2 normal, no murmur, rub or gallop. Rare pause/ectopic beat  ?Breast Exam:   No tenderness, masses, or nipple discharge or inversion. No axillary lymphadenopathy. Large, pendulous breasts    ?Abdomen:   Soft, non-tender, nondistended, normoactive bowel sounds, no masses, no hepatosplenomegaly. Diastasis recti, unchanged, nontender   ?Genitalia:   Normal external genitalia without lesions. BUS and vagina normal; no cervical motion tenderness. No abnormal vaginal discharge. Uterus and adnexa not enlarged,  nontender, no masses. Pap not performed.  ?Rectal:   Normal tone, no masses or tenderness; guaiac negative stool    ?Extremities:   No clubbing, cyanosis or edema    ?Pulses:   2+ and symmetric all extremities

## 2022-01-13 NOTE — Patient Instructions (Addendum)
?HEALTH MAINTENANCE RECOMMENDATIONS: ? ?It is recommended that you get at least 30 minutes of aerobic exercise at least 5 days/week (for weight loss, you may need as much as 60-90 minutes). This can be any activity that gets your heart rate up. This can be divided in 10-15 minute intervals if needed, but try and build up your endurance at least once a week.  Weight bearing exercise is also recommended twice weekly. ? ?Eat a healthy diet with lots of vegetables, fruits and fiber.  "Colorful" foods have a lot of vitamins (ie green vegetables, tomatoes, red peppers, etc).  Limit sweet tea, regular sodas and alcoholic beverages, all of which has a lot of calories and sugar.  Up to 1 alcoholic drink daily may be beneficial for women (unless trying to lose weight, watch sugars).  Drink a lot of water. ? ?Calcium recommendations are 1200-1500 mg daily (1500 mg for postmenopausal women or women without ovaries), and vitamin D 1000 IU daily.  This should be obtained from diet and/or supplements (vitamins), and calcium should not be taken all at once, but in divided doses. ? ?Monthly self breast exams and yearly mammograms for women over the age of 64 is recommended. ? ?Sunscreen of at least SPF 30 should be used on all sun-exposed parts of the skin when outside between the hours of 10 am and 4 pm (not just when at beach or pool, but even with exercise, golf, tennis, and yard work!)  Use a sunscreen that says "broad spectrum" so it covers both UVA and UVB rays, and make sure to reapply every 1-2 hours. ? ?Remember to change the batteries in your smoke detectors when changing your clock times in the spring and fall. Carbon monoxide detectors are recommended for your home. ? ?Use your seat belt every time you are in a car, and please drive safely and not be distracted with cell phones and texting while driving. ? ? ?Bailey Parker , ?Thank you for taking time to come for your Medicare Wellness Visit. I appreciate your ongoing  commitment to your health goals. Please review the following plan we discussed and let me know if I can assist you in the future.  ? ?This is a list of the screening recommended for you and due dates:  ?Health Maintenance  ?Topic Date Due  ? Colon Cancer Screening  04/13/2021  ? Zoster (Shingles) Vaccine (2 of 2) 03/03/2022  ? Flu Shot  04/21/2022  ? Mammogram  06/23/2022  ? Tetanus Vaccine  09/30/2026  ? Pneumonia Vaccine  Completed  ? DEXA scan (bone density measurement)  Completed  ? COVID-19 Vaccine  Completed  ? Hepatitis C Screening: USPSTF Recommendation to screen - Ages 60-79 yo.  Completed  ? HPV Vaccine  Aged Out  ? ? ?Try and get back to using your hand weights (for upper and lower body weight bearing exercise) at least 2x/week. ? ?You are past due for colonoscopy follow-up (per my records). You may want to double check with Dr. Liliane Channel office to verify that screening is now due (in case they have a change in recommendation based on change in guidelines).  If you do, in fact, need another one now, let us know if you'd like a referral elsewhere. ? ?High dose flu shots are recommended yearly. ? ?Please try and quit smoking--start thinking about why/when you smoke (habit, boredom, stress) in order to come up with effective strategies to cut back or quit. Available resources to help you quit include free  counseling through [facility] ([facility]uit[facility] or 1-800-QUITNOW). ?Consider stopping buying them, and setting a quit date will definitely make progress. ? ?

## 2022-01-14 ENCOUNTER — Encounter: Payer: Self-pay | Admitting: Family Medicine

## 2022-01-14 ENCOUNTER — Ambulatory Visit (INDEPENDENT_AMBULATORY_CARE_PROVIDER_SITE_OTHER): Payer: Medicare Other | Admitting: Family Medicine

## 2022-01-14 VITALS — BP 120/78 | HR 64 | Ht 60.0 in | Wt 125.6 lb

## 2022-01-14 DIAGNOSIS — Z0000 Encounter for general adult medical examination without abnormal findings: Secondary | ICD-10-CM | POA: Diagnosis not present

## 2022-01-14 DIAGNOSIS — F17200 Nicotine dependence, unspecified, uncomplicated: Secondary | ICD-10-CM | POA: Diagnosis not present

## 2022-01-14 DIAGNOSIS — E7800 Pure hypercholesterolemia, unspecified: Secondary | ICD-10-CM

## 2022-01-14 DIAGNOSIS — M109 Gout, unspecified: Secondary | ICD-10-CM

## 2022-01-14 DIAGNOSIS — E876 Hypokalemia: Secondary | ICD-10-CM | POA: Diagnosis not present

## 2022-01-14 DIAGNOSIS — Z01419 Encounter for gynecological examination (general) (routine) without abnormal findings: Secondary | ICD-10-CM | POA: Diagnosis not present

## 2022-01-14 DIAGNOSIS — Z5181 Encounter for therapeutic drug level monitoring: Secondary | ICD-10-CM | POA: Diagnosis not present

## 2022-01-14 DIAGNOSIS — I10 Essential (primary) hypertension: Secondary | ICD-10-CM

## 2022-01-14 LAB — COMPREHENSIVE METABOLIC PANEL
ALT: 9 IU/L (ref 0–32)
AST: 17 IU/L (ref 0–40)
Albumin/Globulin Ratio: 1.7 (ref 1.2–2.2)
Albumin: 4.4 g/dL (ref 3.7–4.7)
Alkaline Phosphatase: 88 IU/L (ref 44–121)
BUN/Creatinine Ratio: 12 (ref 12–28)
BUN: 9 mg/dL (ref 8–27)
Bilirubin Total: 0.3 mg/dL (ref 0.0–1.2)
CO2: 25 mmol/L (ref 20–29)
Calcium: 9.6 mg/dL (ref 8.7–10.3)
Chloride: 101 mmol/L (ref 96–106)
Creatinine, Ser: 0.77 mg/dL (ref 0.57–1.00)
Globulin, Total: 2.6 g/dL (ref 1.5–4.5)
Glucose: 90 mg/dL (ref 70–99)
Potassium: 3.9 mmol/L (ref 3.5–5.2)
Sodium: 141 mmol/L (ref 134–144)
Total Protein: 7 g/dL (ref 6.0–8.5)
eGFR: 82 mL/min/{1.73_m2} (ref >59)

## 2022-01-14 LAB — CBC WITH DIFFERENTIAL/PLATELET
Basophils Absolute: 0.1 10*3/uL (ref 0.0–0.2)
Basos: 1 %
EOS (ABSOLUTE): 0.1 10*3/uL (ref 0.0–0.4)
Eos: 2 %
Hematocrit: 38.7 % (ref 34.0–46.6)
Hemoglobin: 13.1 g/dL (ref 11.1–15.9)
Immature Grans (Abs): 0 10*3/uL (ref 0.0–0.1)
Immature Granulocytes: 0 %
Lymphocytes Absolute: 1.9 10*3/uL (ref 0.7–3.1)
Lymphs: 31 %
MCH: 31.4 pg (ref 26.6–33.0)
MCHC: 33.9 g/dL (ref 31.5–35.7)
MCV: 93 fL (ref 79–97)
Monocytes Absolute: 0.5 10*3/uL (ref 0.1–0.9)
Monocytes: 8 %
Neutrophils Absolute: 3.4 10*3/uL (ref 1.4–7.0)
Neutrophils: 58 %
Platelets: 271 10*3/uL (ref 150–450)
RBC: 4.17 x10E6/uL (ref 3.77–5.28)
RDW: 12 % (ref 11.7–15.4)
WBC: 6 10*3/uL (ref 3.4–10.8)

## 2022-01-14 MED ORDER — INDAPAMIDE 2.5 MG PO TABS: 2.5000 mg | ORAL_TABLET | Freq: Every morning | ORAL | 1 refills | Status: DC

## 2022-01-14 MED ORDER — ALLOPURINOL 100 MG PO TABS: 100.0000 mg | ORAL_TABLET | Freq: Every day | ORAL | 1 refills | Status: DC

## 2022-01-15 ENCOUNTER — Encounter: Payer: Self-pay | Admitting: Family Medicine

## 2022-03-12 DIAGNOSIS — K5730 Diverticulosis of large intestine without perforation or abscess without bleeding: Secondary | ICD-10-CM | POA: Diagnosis not present

## 2022-03-12 DIAGNOSIS — Z1211 Encounter for screening for malignant neoplasm of colon: Secondary | ICD-10-CM | POA: Diagnosis not present

## 2022-03-12 DIAGNOSIS — Z86010 Personal history of colonic polyps: Secondary | ICD-10-CM | POA: Diagnosis not present

## 2022-03-12 DIAGNOSIS — K648 Other hemorrhoids: Secondary | ICD-10-CM | POA: Diagnosis not present

## 2022-03-12 LAB — HM COLONOSCOPY

## 2022-03-23 ENCOUNTER — Other Ambulatory Visit: Payer: Self-pay | Admitting: Family Medicine

## 2022-03-23 ENCOUNTER — Encounter: Payer: Self-pay | Admitting: Family Medicine

## 2022-03-23 DIAGNOSIS — E876 Hypokalemia: Secondary | ICD-10-CM

## 2022-05-19 ENCOUNTER — Other Ambulatory Visit: Payer: Self-pay | Admitting: Family Medicine

## 2022-05-19 DIAGNOSIS — Z1231 Encounter for screening mammogram for malignant neoplasm of breast: Secondary | ICD-10-CM

## 2022-05-19 DIAGNOSIS — H00015 Hordeolum externum left lower eyelid: Secondary | ICD-10-CM | POA: Diagnosis not present

## 2022-05-22 ENCOUNTER — Ambulatory Visit
Admission: RE | Admit: 2022-05-22 | Discharge: 2022-05-22 | Disposition: A | Discharge status: Home / Self Care | Payer: Medicare Other | Source: Ambulatory Visit | Attending: Family Medicine | Admitting: Family Medicine

## 2022-05-22 DIAGNOSIS — Z1231 Encounter for screening mammogram for malignant neoplasm of breast: Secondary | ICD-10-CM | POA: Diagnosis not present

## 2022-06-08 ENCOUNTER — Other Ambulatory Visit: Payer: Self-pay | Admitting: Family Medicine

## 2022-06-08 DIAGNOSIS — M109 Gout, unspecified: Secondary | ICD-10-CM

## 2022-06-08 DIAGNOSIS — E7800 Pure hypercholesterolemia, unspecified: Secondary | ICD-10-CM

## 2022-06-08 DIAGNOSIS — I10 Essential (primary) hypertension: Secondary | ICD-10-CM

## 2022-07-15 ENCOUNTER — Encounter: Payer: Medicare Other | Admitting: Family Medicine

## 2022-07-15 NOTE — Patient Instructions (Signed)
RSV vaccine is recommended.  You need to get this from the pharmacy, and separate it from other vaccines by 2 weeks.
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
## 2022-07-16 ENCOUNTER — Encounter: Payer: Self-pay | Admitting: Family Medicine

## 2022-07-16 ENCOUNTER — Ambulatory Visit (INDEPENDENT_AMBULATORY_CARE_PROVIDER_SITE_OTHER): Payer: Medicare Other | Admitting: Family Medicine

## 2022-07-16 VITALS — BP 138/84 | HR 66 | Temp 98.1°F | Wt 123.6 lb

## 2022-07-16 DIAGNOSIS — E7800 Pure hypercholesterolemia, unspecified: Secondary | ICD-10-CM

## 2022-07-16 DIAGNOSIS — E876 Hypokalemia: Secondary | ICD-10-CM

## 2022-07-16 DIAGNOSIS — F17200 Nicotine dependence, unspecified, uncomplicated: Secondary | ICD-10-CM | POA: Diagnosis not present

## 2022-07-16 DIAGNOSIS — R7303 Prediabetes: Secondary | ICD-10-CM | POA: Diagnosis not present

## 2022-07-16 DIAGNOSIS — Z5181 Encounter for therapeutic drug level monitoring: Secondary | ICD-10-CM | POA: Diagnosis not present

## 2022-07-16 DIAGNOSIS — I10 Essential (primary) hypertension: Secondary | ICD-10-CM

## 2022-07-16 DIAGNOSIS — Z23 Encounter for immunization: Secondary | ICD-10-CM

## 2022-07-16 DIAGNOSIS — M109 Gout, unspecified: Secondary | ICD-10-CM | POA: Diagnosis not present

## 2022-07-17 ENCOUNTER — Encounter: Payer: Self-pay | Admitting: Family Medicine

## 2022-07-17 LAB — LIPID PANEL
Chol/HDL Ratio: 2.5 ratio (ref 0.0–4.4)
Cholesterol, Total: 123 mg/dL (ref 100–199)
HDL: 49 mg/dL (ref >39)
LDL Chol Calc (NIH): 61 mg/dL (ref 0–99)
Triglycerides: 63 mg/dL (ref 0–149)
VLDL Cholesterol Cal: 13 mg/dL (ref 5–40)

## 2022-07-17 LAB — URIC ACID: Uric Acid: 4.2 mg/dL (ref 3.1–7.9)

## 2022-07-17 LAB — GLUCOSE, RANDOM: Glucose: 90 mg/dL (ref 70–99)

## 2022-07-17 MED ORDER — POTASSIUM CHLORIDE 20 MEQ/15ML (10%) PO SOLN: ORAL | 3 refills | Status: DC

## 2022-07-17 MED ORDER — INDAPAMIDE 2.5 MG PO TABS
2.5000 mg | ORAL_TABLET | Freq: Every morning | ORAL | 1 refills | Status: DC
Start: 2022-07-17 — End: 2022-12-07

## 2022-07-17 MED ORDER — SIMVASTATIN 40 MG PO TABS: 40.0000 mg | ORAL_TABLET | Freq: Every evening | ORAL | 3 refills | Status: DC

## 2022-07-17 MED ORDER — ALLOPURINOL 100 MG PO TABS: 100.0000 mg | ORAL_TABLET | Freq: Every day | ORAL | 3 refills | Status: DC

## 2022-08-24 DIAGNOSIS — H5213 Myopia, bilateral: Secondary | ICD-10-CM | POA: Diagnosis not present

## 2022-08-24 DIAGNOSIS — H401131 Primary open-angle glaucoma, bilateral, mild stage: Secondary | ICD-10-CM | POA: Diagnosis not present

## 2022-08-24 DIAGNOSIS — H2513 Age-related nuclear cataract, bilateral: Secondary | ICD-10-CM | POA: Diagnosis not present

## 2022-12-07 ENCOUNTER — Other Ambulatory Visit: Payer: Self-pay | Admitting: Family Medicine

## 2022-12-07 DIAGNOSIS — I10 Essential (primary) hypertension: Secondary | ICD-10-CM

## 2023-01-24 NOTE — Progress Notes (Unsigned)
No chief complaint on file.   Bailey Parker is a 72 y.o. female who presents for annual wellness visit and follow-up on chronic medical conditions.    Hypertension follow-up: She reports compliance with her medications.  She checks her blood pressure with home monitor (which she states was verified as accurate in the past). BP's are running  Denies dizziness, headaches, chest pain, edema, muscle cramps. Denies side effects of medications.     H/o hypokalemia--related to diuretic use.  This has been adequately replaced on current supplements. She denies muscle cramps.  Lab Results  Component Value Date   K 3.9 01/14/2022    Hyperlipidemia follow-up: Patient is reportedly following a low-fat, low cholesterol diet. Compliant with medications (simvastatin 40mg ) and denies medication side effects. She has been at goal on this regimen: Lab Results  Component Value Date   CHOL 123 07/16/2022   HDL 49 07/16/2022   LDLCALC 61 07/16/2022   TRIG 63 07/16/2022   CHOLHDL 2.5 07/16/2022    Gout: Denies any flares in many years, but due to severity of flare, isn't interested in trial off allopurinol (given low uric acid levels). She is compliant with low dose allopurinol.   Lab Results  Component Value Date   LABURIC 4.2 07/16/2022     Smoking:  Typically smokes 3-5/day.  She has never smoked more than 1/2 PPD, and usually less, for 40 years.   H/o pre-diabetes:  She continues to watch her diet (limit sweets, portions). Last check of A1c was normal (5.6 in 10/2018).  Last abnormal was in 2016, highest was A1c 5.8. Fasting sugars have been normal, was 90 in 12/2021 and 06/2022. She is doing her husband's Parkinson's workouts with him (10 minutes daily) She continues to do 20-30 minutes 3-4 times/week on the bicycle. Uses handweights 3x/week.   Immunization History  Administered Date(s) Administered   COVID-19, mRNA, vaccine(Comirnaty)12 years and older 07/16/2022   PFIZER(Purple  Top)SARS-COV-2 Vaccination 01/10/2020, 01/24/2020, 08/06/2020, 02/05/2021   Pfizer Covid-19 Vaccine Bivalent Booster 41yrs & up 07/30/2021   Pneumococcal Conjugate-13 01/22/2014   Pneumococcal Polysaccharide-23 06/19/2015   Td 08/03/1995, 09/30/2016   Tdap 02/02/2006   Zoster Recombinat (Shingrix) 01/06/2022, 04/08/2022   refuses flu shots Last Pap smear: 11/2019--normal; no high risk HPV detected  Last mammogram: 05/2022 Last colonoscopy: 02/2022 (Dr. Sheela Stack hemorrhoids and diverticulosis. 5 year f/u recommended (prior--03/2016; polyps) Last DEXA: 05/2017 T-1.1 (stable from prior DEXA elsewhere).  Dentist: 2x/year, cleanings 4x/year Ophtho: twice yearly   Exercise:  She is doing her husband's Parkinson's workouts with him (10 minutes daily) She continues to do 20-30 minutes 3-4 times/week on the bicycle. Uses handweights 3x/week.    Patient Care Team: Joselyn Arrow, MD as PCP - General (Family Medicine) Sharrell Ku, MD as Consulting Physician (Gastroenterology) Maris Berger, MD as Consulting Physician (Ophthalmology) Dentist: Dr. Azucena Cecil   Depression Screening: Flowsheet Row Office Visit from 01/14/2022 in [facility]  PHQ-2 Total Score 0        Falls screen:     01/14/2022    9:43 AM 07/16/2021   10:42 AM 01/08/2021    9:36 AM 06/10/2020   10:51 AM 12/04/2019    9:50 AM  Fall Risk   Falls in the past year? 0 0 0 0 0  Number falls in past yr: 0 0 0    Injury with Fall? 0 0 0    Risk for fall due to : No Fall Risks No Fall Risks No Fall Risks  Follow up Falls evaluation completed Falls evaluation completed Falls evaluation completed       Functional Status Survey:          End of Life Discussion:  Patient has a living will and medical power of attorney, in chart   PMH, PSH, SH and FH were reviewed and updated.   ROS: The patient denies anorexia, fever, weight changes, headaches, vision changes, decreased hearing, ear pain, sore  throat, breast concerns, chest pain, palpitations, dizziness, syncope, dyspnea on exertion, cough, swelling, nausea, vomiting, diarrhea, constipation, abdominal pain, melena, hematochezia, indigestion/heartburn, hematuria, incontinence, dysuria, vaginal bleeding, discharge, odor or itch, genital lesions, joint pains, numbness, tingling, weakness, tremor, suspicious skin lesions, depression, anxiety, abnormal bleeding/bruising, or enlarged lymph nodes.   PHYSICAL EXAM:  There were no vitals taken for this visit.  Wt Readings from Last 3 Encounters:  07/16/22 123 lb 9.6 oz (56.1 kg)  01/14/22 125 lb 9.6 oz (57 kg)  07/16/21 126 lb 12.8 oz (57.5 kg)    General Appearance:   Alert, cooperative, no distress, appears stated age    Head:   Normocephalic, without obvious abnormality, atraumatic    Eyes:   PERRL, conjunctiva/corneas clear, EOM's intact, fundi benign    Ears:   Normal TM's and external ear canals    Nose:   Normal, no drainage  Throat:   Normal, no lesions  Neck:   Supple, no lymphadenopathy; thyroid: no enlargement/ tenderness/nodules; no carotid bruit or JVD. Slightly prominent submandibular glands noted, symmetric, unchanged  Back:   Spine nontender, no curvature, ROM normal, no CVA tenderness.   Lungs:   Clear to auscultation bilaterally without wheezes, rales or ronchi; respirations unlabored    Chest Wall:   No tenderness or deformity   Heart:   Regular rate and rhythm, S1 and S2 normal, no murmur, rub or gallop. Rare pause/ectopic beat  Breast Exam:   No tenderness, masses, or nipple discharge or inversion. No axillary lymphadenopathy. Large, pendulous breasts    Abdomen:   Soft, non-tender, nondistended, normoactive bowel sounds, no masses, no hepatosplenomegaly. Diastasis recti, unchanged, nontender   Genitalia:   Normal external genitalia without lesions. BUS and vagina normal; no cervical motion tenderness. No abnormal vaginal discharge. Uterus and adnexa not enlarged,  nontender, no masses. Pap not performed.  Rectal:   Normal tone, no masses or tenderness; guaiac negative stool    Extremities:   No clubbing, cyanosis or edema    Pulses:   2+ and symmetric all extremities    Skin:   Skin color, texture, turgor normal, no rashes or lesions    Lymph nodes:   Cervical, supraclavicular, inguinal and axillary nodes normal    Neurologic:   Normal strength, sensation and gait; reflexes 2+ and symmetric throughout                        Psych:   Normal mood, affect, hygiene and grooming   ASSESSMENT/PLAN:  Prevnar-20 COVID booster also rec (not both)   c-met, CBC Lipids at goal on last check  Indapamide not needed until mid-June, no refills needed today.  G0101 code cannot be used this year.***   Discussed monthly self breast exams and yearly mammograms; at least 30 minutes of aerobic activity at least 5 days/week and weight-bearing exercise 2x/week; proper sunscreen use reviewed; healthy diet, including goals of calcium and vitamin D intake and alcohol recommendations (less than or equal to 1 drink/day) reviewed, smoking cessation; regular seatbelt use; changing  batteries in smoke detectors.  Immunization recommendations discussed. High dose flu shots are recommended yearly--she declines.  Prevnar-20 COVID booster Colonoscopy recommendations reviewed, UTD No further pap smears are indicated   MOST form reviewed, Full Code, Full Care   F/u 6 mos   Medicare Attestation I have personally reviewed: The patient's medical and social history Their use of alcohol, tobacco or illicit drugs Their current medications and supplements The patient's functional ability including ADLs,fall risks, home safety risks, cognitive, and hearing and visual impairment Diet and physical activities Evidence for depression or mood disorders  The patient's weight, height, BMI have been recorded in the chart.  I have made referrals, counseling, and provided education to the  patient based on review of the above and I have provided the patient with a written personalized care plan for preventive services.

## 2023-01-24 NOTE — Patient Instructions (Incomplete)
HEALTH MAINTENANCE RECOMMENDATIONS:  It is recommended that you get at least 30 minutes of aerobic exercise at least 5 days/week (for weight loss, you may need as much as 60-90 minutes). This can be any activity that gets your heart rate up. This can be divided in 10-15 minute intervals if needed, but try and build up your endurance at least once a week.  Weight bearing exercise is also recommended twice weekly.  Eat a healthy diet with lots of vegetables, fruits and fiber.  "Colorful" foods have a lot of vitamins (ie green vegetables, tomatoes, red peppers, etc).  Limit sweet tea, regular sodas and alcoholic beverages, all of which has a lot of calories and sugar.  Up to 1 alcoholic drink daily may be beneficial for women (unless trying to lose weight, watch sugars).  Drink a lot of water.  Calcium recommendations are 1200-1500 mg daily (1500 mg for postmenopausal women or women without ovaries), and vitamin D 1000 IU daily.  This should be obtained from diet and/or supplements (vitamins), and calcium should not be taken all at once, but in divided doses.  Monthly self breast exams and yearly mammograms for women over the age of 67 is recommended.  Sunscreen of at least SPF 30 should be used on all sun-exposed parts of the skin when outside between the hours of 10 am and 4 pm (not just when at beach or pool, but even with exercise, golf, tennis, and yard work!)  Use a sunscreen that says "broad spectrum" so it covers both UVA and UVB rays, and make sure to reapply every 1-2 hours.  Remember to change the batteries in your smoke detectors when changing your clock times in the spring and fall. Carbon monoxide detectors are recommended for your home.  Use your seat belt every time you are in a car, and please drive safely and not be distracted with cell phones and texting while driving.   Bailey Parker , Thank you for taking time to come for your Medicare Wellness Visit. I appreciate your ongoing  commitment to your health goals. Please review the following plan we discussed and let me know if I can assist you in the future.   This is a list of the screening recommended for you and due dates:  Health Maintenance  Topic Date Due   Flu Shot  04/22/2023   Mammogram  05/23/2023   Medicare Annual Wellness Visit  01/25/2024   DTaP/Tdap/Td vaccine (4 - Td or Tdap) 09/30/2026   Colon Cancer Screening  03/13/2027   Pneumonia Vaccine  Completed   DEXA scan (bone density measurement)  Completed   COVID-19 Vaccine  Completed   Hepatitis C Screening: USPSTF Recommendation to screen - Ages 85-79 yo.  Completed   Zoster (Shingles) Vaccine  Completed   HPV Vaccine  Aged Out    Your blood pressure was very good in the office today. Periodically check it at home.  Goal is under 130/80.  Continue to limit salt.   If your blood pressure is high, wait 5 minutes, relax and recheck. Follow-up if blood pressure is consistently over 135/85.  We discussed quitting smoking again. Continue to give it thought.  Consider just not lighting the cigarette, and eventually not buying them, vs setting a quit date and committing to it.  1-800-QUITNOW and [employer] is also a Chief Technology Officer.  Please get back on the exercise bike. Goal is 150 minutes of aerobic exercise each week, and you had been getting that when you  used the bike regularly.  Please get the RSV vaccine from the pharmacy in the Fall. Prevnar-20 (newest pneumonia shot) and a COVID booster are recommended.  You can schedule nurse visits for these, or get them from the pharmacy.  I recommend getting them 2 weeks apart.

## 2023-01-25 ENCOUNTER — Ambulatory Visit (INDEPENDENT_AMBULATORY_CARE_PROVIDER_SITE_OTHER): Payer: Medicare Other | Admitting: Family Medicine

## 2023-01-25 ENCOUNTER — Encounter: Payer: Self-pay | Admitting: Family Medicine

## 2023-01-25 VITALS — BP 128/78 | HR 64 | Ht 59.5 in | Wt 117.2 lb

## 2023-01-25 DIAGNOSIS — Z5181 Encounter for therapeutic drug level monitoring: Secondary | ICD-10-CM | POA: Diagnosis not present

## 2023-01-25 DIAGNOSIS — M109 Gout, unspecified: Secondary | ICD-10-CM | POA: Diagnosis not present

## 2023-01-25 DIAGNOSIS — E7800 Pure hypercholesterolemia, unspecified: Secondary | ICD-10-CM | POA: Diagnosis not present

## 2023-01-25 DIAGNOSIS — Z0000 Encounter for general adult medical examination without abnormal findings: Secondary | ICD-10-CM

## 2023-01-25 DIAGNOSIS — I10 Essential (primary) hypertension: Secondary | ICD-10-CM

## 2023-01-25 DIAGNOSIS — Z6823 Body mass index (BMI) 23.0-23.9, adult: Secondary | ICD-10-CM

## 2023-01-25 DIAGNOSIS — E876 Hypokalemia: Secondary | ICD-10-CM | POA: Diagnosis not present

## 2023-01-25 DIAGNOSIS — F17200 Nicotine dependence, unspecified, uncomplicated: Secondary | ICD-10-CM | POA: Diagnosis not present

## 2023-01-26 LAB — COMPREHENSIVE METABOLIC PANEL
ALT: 13 IU/L (ref 0–32)
AST: 16 IU/L (ref 0–40)
Albumin/Globulin Ratio: 1.6 (ref 1.2–2.2)
Albumin: 4.3 g/dL (ref 3.8–4.8)
Alkaline Phosphatase: 79 IU/L (ref 44–121)
BUN/Creatinine Ratio: 11 — ABNORMAL LOW (ref 12–28)
BUN: 9 mg/dL (ref 8–27)
Bilirubin Total: 0.4 mg/dL (ref 0.0–1.2)
CO2: 26 mmol/L (ref 20–29)
Calcium: 9.7 mg/dL (ref 8.7–10.3)
Chloride: 99 mmol/L (ref 96–106)
Creatinine, Ser: 0.79 mg/dL (ref 0.57–1.00)
Globulin, Total: 2.7 g/dL (ref 1.5–4.5)
Glucose: 89 mg/dL (ref 70–99)
Potassium: 3.4 mmol/L — ABNORMAL LOW (ref 3.5–5.2)
Sodium: 140 mmol/L (ref 134–144)
Total Protein: 7 g/dL (ref 6.0–8.5)
eGFR: 79 mL/min/{1.73_m2} (ref >59)

## 2023-01-26 LAB — CBC WITH DIFFERENTIAL/PLATELET
Basophils Absolute: 0.1 10*3/uL (ref 0.0–0.2)
Basos: 1 %
EOS (ABSOLUTE): 0 10*3/uL (ref 0.0–0.4)
Eos: 0 %
Hematocrit: 38.8 % (ref 34.0–46.6)
Hemoglobin: 13 g/dL (ref 11.1–15.9)
Immature Grans (Abs): 0 10*3/uL (ref 0.0–0.1)
Immature Granulocytes: 0 %
Lymphocytes Absolute: 1.6 10*3/uL (ref 0.7–3.1)
Lymphs: 27 %
MCH: 31.8 pg (ref 26.6–33.0)
MCHC: 33.5 g/dL (ref 31.5–35.7)
MCV: 95 fL (ref 79–97)
Monocytes Absolute: 0.5 10*3/uL (ref 0.1–0.9)
Monocytes: 8 %
Neutrophils Absolute: 3.8 10*3/uL (ref 1.4–7.0)
Neutrophils: 64 %
Platelets: 231 10*3/uL (ref 150–450)
RBC: 4.09 x10E6/uL (ref 3.77–5.28)
RDW: 11.9 % (ref 11.7–15.4)
WBC: 5.9 10*3/uL (ref 3.4–10.8)

## 2023-02-15 ENCOUNTER — Other Ambulatory Visit: Payer: Self-pay | Admitting: Family Medicine

## 2023-02-15 DIAGNOSIS — I10 Essential (primary) hypertension: Secondary | ICD-10-CM

## 2023-02-27 DIAGNOSIS — Z23 Encounter for immunization: Secondary | ICD-10-CM | POA: Diagnosis not present

## 2023-03-09 DIAGNOSIS — H401131 Primary open-angle glaucoma, bilateral, mild stage: Secondary | ICD-10-CM | POA: Diagnosis not present

## 2023-04-13 ENCOUNTER — Inpatient Hospital Stay
Admission: RE | Admit: 2023-04-13 | Discharge: 2023-04-13 | Disposition: A | Discharge status: Home / Self Care | Payer: Medicare Other | Source: Ambulatory Visit | Attending: Family Medicine | Admitting: Family Medicine

## 2023-04-14 ENCOUNTER — Other Ambulatory Visit: Payer: Self-pay | Admitting: Physician Assistant

## 2023-04-14 DIAGNOSIS — R1319 Other dysphagia: Secondary | ICD-10-CM | POA: Diagnosis not present

## 2023-04-20 ENCOUNTER — Other Ambulatory Visit: Payer: Self-pay | Admitting: Physician Assistant

## 2023-04-20 ENCOUNTER — Ambulatory Visit
Admission: RE | Admit: 2023-04-20 | Discharge: 2023-04-20 | Disposition: A | Discharge status: Home / Self Care | Payer: Medicare Other | Source: Ambulatory Visit | Attending: Physician Assistant | Admitting: Physician Assistant

## 2023-04-20 DIAGNOSIS — R1310 Dysphagia, unspecified: Secondary | ICD-10-CM | POA: Diagnosis not present

## 2023-04-20 DIAGNOSIS — R1319 Other dysphagia: Secondary | ICD-10-CM

## 2023-04-21 ENCOUNTER — Other Ambulatory Visit: Payer: Self-pay | Admitting: Family Medicine

## 2023-04-21 DIAGNOSIS — Z1231 Encounter for screening mammogram for malignant neoplasm of breast: Secondary | ICD-10-CM

## 2023-05-26 ENCOUNTER — Ambulatory Visit
Admission: RE | Admit: 2023-05-26 | Discharge: 2023-05-26 | Disposition: A | Discharge status: Home / Self Care | Payer: Medicare Other | Source: Ambulatory Visit | Attending: Family Medicine | Admitting: Family Medicine

## 2023-05-26 DIAGNOSIS — Z1231 Encounter for screening mammogram for malignant neoplasm of breast: Secondary | ICD-10-CM

## 2023-06-01 ENCOUNTER — Telehealth: Payer: Self-pay | Admitting: *Deleted

## 2023-06-01 NOTE — Telephone Encounter (Signed)
[facility] notes aren't available. Ba swallow was not normal-- IMPRESSION: 1. 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 2. 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  I think she should schedule a f/u with me, to see how she is doing.  If she has ongoing symptoms, she may need to see GI. Also to have discussion of potential risks vs benefits of ongoing PPI use

## 2023-06-01 NOTE — Telephone Encounter (Signed)
Patient saw UC for trouble swallowing and they ordered a barium swallow. She said they didn't call her with results so she went up there and they gave her a prescription for protonix 40mg  with 5 refills on 05/11/23. She is taking and is not having any problems. She is scheduled for med check w/you 07/28/23 should she keep taking until then?

## 2023-06-02 NOTE — Telephone Encounter (Signed)
Spoke with pt and scheduled her for 06/17/23.

## 2023-06-16 NOTE — Progress Notes (Unsigned)
No chief complaint on file.  She went to [facility] for trouble swallowing in late July. They ordered barium swallow, didn't get results. She went there in August, and was given a prescription for protonix 40mg  with 5 refills on 05/11/23. She is taking and is not having any problems.  She had sent a message asking if she should continue the medication until her next visit (which was scheduled for November). She was advised to schedule f/u appt.  Ba swallow results were reviewed, and she was asked to schedule f/u. She presents to f/u on her swallowing difficulty. She has been taking protonix daily.  Ba Swallow 04/20/23: IMPRESSION: 1. 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 2. 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      PMH, PSH, SH reviewed  Still smoking   ROS:   PHYSICAL EXAM:  There were no vitals taken for this visit.  Wt Readings from Last 3 Encounters:  01/25/23 117 lb 3.2 oz (53.2 kg)  07/16/22 123 lb 9.6 oz (56.1 kg)  01/14/22 125 lb 9.6 oz (57 kg)      ASSESSMENT/PLAN:   Flu and COVID shots RSV from pharmacy in 2 weeks if she hasn't already gotten. Prevnar-20 at her next visit in November

## 2023-06-17 ENCOUNTER — Encounter: Payer: Self-pay | Admitting: Family Medicine

## 2023-06-17 ENCOUNTER — Ambulatory Visit: Payer: Medicare Other | Admitting: Family Medicine

## 2023-06-17 VITALS — BP 120/60 | HR 64 | Ht 59.5 in | Wt 116.4 lb

## 2023-06-17 DIAGNOSIS — R0981 Nasal congestion: Secondary | ICD-10-CM | POA: Diagnosis not present

## 2023-06-17 DIAGNOSIS — K222 Esophageal obstruction: Secondary | ICD-10-CM | POA: Diagnosis not present

## 2023-06-17 DIAGNOSIS — Q396 Congenital diverticulum of esophagus: Secondary | ICD-10-CM | POA: Diagnosis not present

## 2023-06-17 DIAGNOSIS — R1310 Dysphagia, unspecified: Secondary | ICD-10-CM | POA: Diagnosis not present

## 2023-06-17 DIAGNOSIS — Z23 Encounter for immunization: Secondary | ICD-10-CM | POA: Diagnosis not present

## 2023-06-17 DIAGNOSIS — M545 Low back pain, unspecified: Secondary | ICD-10-CM | POA: Diagnosis not present

## 2023-06-17 DIAGNOSIS — F17200 Nicotine dependence, unspecified, uncomplicated: Secondary | ICD-10-CM

## 2023-06-17 NOTE — Patient Instructions (Addendum)
  Please work on trying to find other ways to reduce stress. I encourage you to try and quit smoking.  We are referring you to [facility] for follow-up on your abnormal barium swallow, and ongoing trouble swallowing. Continue the prescribed medication (pantoprazole).  Please get the RSV vaccine from the pharmacy.  Wait 2 weeks from today's visit.  Use tylenol if needed for low back pain. Tylenol is preferred over ibuprofen, if effective. If tylenol doesn't help, you may take some ibuprofen with food. Do some stretches and core strengthening (see handouts). If you have ongoing pain, we can check x-rays of your back. Let us know.

## 2023-07-06 ENCOUNTER — Other Ambulatory Visit: Payer: Self-pay | Admitting: Family Medicine

## 2023-07-06 DIAGNOSIS — M109 Gout, unspecified: Secondary | ICD-10-CM

## 2023-07-06 DIAGNOSIS — E7800 Pure hypercholesterolemia, unspecified: Secondary | ICD-10-CM

## 2023-07-07 ENCOUNTER — Telehealth: Payer: Self-pay | Admitting: Gastroenterology

## 2023-07-07 NOTE — Telephone Encounter (Signed)
Good afternoon Dr. Chales Abrahams,   Supervising Provider PM 10/16   We received a call from this patient to be scheduled for an endoscopy. Patient last had procedure with Dr. Kinnie Scales in 02/2022. Since then Dr. Kinnie Scales has retired and patient would like to be established with [facility]. Records are in Epic and Media for you to review. Would you please advise on scheduling?  Thank you.

## 2023-07-15 NOTE — Telephone Encounter (Signed)
OK to schedule in APP clinic RG 

## 2023-07-21 ENCOUNTER — Encounter: Payer: Self-pay | Admitting: Nurse Practitioner

## 2023-07-26 ENCOUNTER — Telehealth: Payer: Self-pay | Admitting: *Deleted

## 2023-07-26 NOTE — Telephone Encounter (Signed)
Does patient need to fast for med check Wed?

## 2023-07-26 NOTE — Telephone Encounter (Signed)
Yes. (On statin and last lipids 06/2022)

## 2023-07-27 NOTE — Progress Notes (Unsigned)
No chief complaint on file.  Patient was last seen in September, as UC follow-up from end of July. Ba swallow showed lower cervical esophagus diverticulum with mild esophageal stricture at the level of the diverticulum. 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 She had been started on Protonix 40 mg daily.  At her last visit she reported improvement in symptoms, but occasionally would get food caught in her lower chest, and has to cough it up (chewed food). She was referred to GI. She is scheduled to see NP in December.  At her last visit she also complained about intermittent LBP, mainly going from sitting to standing. No pain with twisting, bending. Feels like a catch, short-lived. No radiation of pain, no numbness, tingling, weakness. No falls or trauma. We discussed use of Tylenol, core strengthening and stretching. To contact us if persisted for X-rays.  Hypertension follow-up: She reports compliance with her medications.  She checks her blood pressure with home monitor (which she states was verified as accurate in the past). BP's are running  Denies dizziness, headaches, chest pain, edema, muscle cramps. Denies side effects of medications.    H/o hypokalemia--related to diuretic use.  Last K+ level was slightly low.  She denies muscle cramps.  Lab Results  Component Value Date   K 3.4 (L) 01/25/2023      Hyperlipidemia follow-up: Patient is reportedly following a low-fat, low cholesterol diet. Compliant with medications (simvastatin 40mg ) and denies medication side effects. She has been at goal on this regimen, due for recheck  Lab Results  Component Value Date   CHOL 123 07/16/2022   HDL 49 07/16/2022   LDLCALC 61 07/16/2022   TRIG 63 07/16/2022   CHOLHDL 2.5 07/16/2022      Gout: Denies any flares in many years, but due to severity of flare, isn't interested in trial off allopurinol (given low uric acid levels). She is compliant with low dose allopurinol.   Due for recheck of uric acid. Lab Results  Component Value Date   LABURIC 4.2 07/16/2022      She continues to smoke. 0-5/day.  Last visit she stated "Not a good time to quit."    She is doing her husband's Parkinson's workouts with him (10 minutes daily) She isn't using the exercise bike much anymore. Uses handweights 3x/week.   PMH, PSH, SH reviewed    ROS: no f/c, URI symptoms, cough, shortness of breath. +PND/throat-clearing.   +dysphagia, sporadic, sometwhat improved. No nausea, vomiting, heartburn, bowel changes.  No urinary complaints. No HA, dizziness, chest pain.   PHYSICAL EXAM:  There were no vitals taken for this visit.  Wt Readings from Last 3 Encounters:  06/17/23 116 lb 6.4 oz (52.8 kg)  01/25/23 117 lb 3.2 oz (53.2 kg)  07/16/22 123 lb 9.6 oz (56.1 kg)    Thin, pleasant female in no distress. HEENT: conjunctiva and sclera are clear, EOMI. OP clear, sinuses nontender Neck: no lymphadenopathy or mass Heart: regular rate and rhythm, no murmur Lungs: clear bilaterally Back: no spinal or CVA tenderness, no SI or CVA tenderness. No muscle tenderness or spasm Abdomen: normal bowel sounds, soft, nontender, no organomegaly or mass Extremities: no edema Neuro: alert and oriented, cranial nerves grossly intact. Normal gait Psych: normal mood, affect, hygiene and grooming Skin: normal turgor, no visible rash   ***UPDATE any throat-clearing?   ASSESSMENT/PLAN:  Did she get RSV vaccine? Still refusing flu shot?  RF lozol. ?need potassium RF?   F/u in 6 months for AWV/med  check, sooner prn.

## 2023-07-27 NOTE — Telephone Encounter (Signed)
Patient advised.

## 2023-07-28 ENCOUNTER — Telehealth: Payer: Self-pay | Admitting: Family Medicine

## 2023-07-28 ENCOUNTER — Ambulatory Visit (INDEPENDENT_AMBULATORY_CARE_PROVIDER_SITE_OTHER): Payer: Medicare Other | Admitting: Family Medicine

## 2023-07-28 ENCOUNTER — Encounter: Payer: Self-pay | Admitting: Family Medicine

## 2023-07-28 VITALS — BP 130/84 | HR 64 | Ht 59.5 in | Wt 116.6 lb

## 2023-07-28 DIAGNOSIS — Z5181 Encounter for therapeutic drug level monitoring: Secondary | ICD-10-CM

## 2023-07-28 DIAGNOSIS — I10 Essential (primary) hypertension: Secondary | ICD-10-CM | POA: Diagnosis not present

## 2023-07-28 DIAGNOSIS — E876 Hypokalemia: Secondary | ICD-10-CM

## 2023-07-28 DIAGNOSIS — E7800 Pure hypercholesterolemia, unspecified: Secondary | ICD-10-CM

## 2023-07-28 DIAGNOSIS — Q396 Congenital diverticulum of esophagus: Secondary | ICD-10-CM

## 2023-07-28 DIAGNOSIS — F17200 Nicotine dependence, unspecified, uncomplicated: Secondary | ICD-10-CM

## 2023-07-28 DIAGNOSIS — M109 Gout, unspecified: Secondary | ICD-10-CM

## 2023-07-28 NOTE — Telephone Encounter (Signed)
Cpe is afternoon,(01/31/24) can she have fasting labs prior

## 2023-07-28 NOTE — Patient Instructions (Addendum)
Try and increase your exercise to at least 150 minutes each week. Consider getting back on the exercise bike for 15 minutes daily.  I recommend getting the RSV vaccine from the pharmacy. Yearly flu shots are also recommended.

## 2023-07-29 ENCOUNTER — Encounter: Payer: Self-pay | Admitting: Family Medicine

## 2023-07-29 LAB — CMP14+EGFR
ALT: 9 [IU]/L (ref 0–32)
AST: 15 [IU]/L (ref 0–40)
Albumin: 4.3 g/dL (ref 3.8–4.8)
Alkaline Phosphatase: 66 [IU]/L (ref 44–121)
BUN/Creatinine Ratio: 13 (ref 12–28)
BUN: 11 mg/dL (ref 8–27)
Bilirubin Total: 0.3 mg/dL (ref 0.0–1.2)
CO2: 25 mmol/L (ref 20–29)
Calcium: 9.9 mg/dL (ref 8.7–10.3)
Chloride: 104 mmol/L (ref 96–106)
Creatinine, Ser: 0.82 mg/dL (ref 0.57–1.00)
Globulin, Total: 2.8 g/dL (ref 1.5–4.5)
Glucose: 90 mg/dL (ref 70–99)
Potassium: 4.3 mmol/L (ref 3.5–5.2)
Sodium: 142 mmol/L (ref 134–144)
Total Protein: 7.1 g/dL (ref 6.0–8.5)
eGFR: 75 mL/min/{1.73_m2} (ref >59)

## 2023-07-29 LAB — LIPID PANEL
Chol/HDL Ratio: 2.5 ratio (ref 0.0–4.4)
Cholesterol, Total: 145 mg/dL (ref 100–199)
HDL: 58 mg/dL (ref >39)
LDL Chol Calc (NIH): 75 mg/dL (ref 0–99)
Triglycerides: 56 mg/dL (ref 0–149)
VLDL Cholesterol Cal: 12 mg/dL (ref 5–40)

## 2023-07-29 LAB — CBC WITH DIFFERENTIAL/PLATELET
Basophils Absolute: 0 10*3/uL (ref 0.0–0.2)
Basos: 1 %
EOS (ABSOLUTE): 0 10*3/uL (ref 0.0–0.4)
Eos: 0 %
Hematocrit: 39.5 % (ref 34.0–46.6)
Hemoglobin: 12.8 g/dL (ref 11.1–15.9)
Immature Grans (Abs): 0 10*3/uL (ref 0.0–0.1)
Immature Granulocytes: 0 %
Lymphocytes Absolute: 1.5 10*3/uL (ref 0.7–3.1)
Lymphs: 31 %
MCH: 30.9 pg (ref 26.6–33.0)
MCHC: 32.4 g/dL (ref 31.5–35.7)
MCV: 95 fL (ref 79–97)
Monocytes Absolute: 0.3 10*3/uL (ref 0.1–0.9)
Monocytes: 7 %
Neutrophils Absolute: 2.9 10*3/uL (ref 1.4–7.0)
Neutrophils: 61 %
Platelets: 235 10*3/uL (ref 150–450)
RBC: 4.14 x10E6/uL (ref 3.77–5.28)
RDW: 12.1 % (ref 11.7–15.4)
WBC: 4.7 10*3/uL (ref 3.4–10.8)

## 2023-07-29 LAB — URIC ACID: Uric Acid: 4.3 mg/dL (ref 3.1–7.9)

## 2023-07-29 NOTE — Telephone Encounter (Signed)
I had to wait for yesterday's labs to come back before I could decide what was needed.    She had all of her fasting labs yesterday. She does not need any fasting labs before her physical (we got off by 6 months, needing her fasting labs at the med check, not the physical).  I may do a chem panel at her physical, but she does NOT need to fast. Doesn't need an appointment prior to her physical.

## 2023-08-27 DIAGNOSIS — H2512 Age-related nuclear cataract, left eye: Secondary | ICD-10-CM | POA: Diagnosis not present

## 2023-08-27 DIAGNOSIS — H401131 Primary open-angle glaucoma, bilateral, mild stage: Secondary | ICD-10-CM | POA: Diagnosis not present

## 2023-08-27 DIAGNOSIS — H524 Presbyopia: Secondary | ICD-10-CM | POA: Diagnosis not present

## 2023-08-31 ENCOUNTER — Ambulatory Visit (INDEPENDENT_AMBULATORY_CARE_PROVIDER_SITE_OTHER): Payer: Medicare Other | Admitting: Nurse Practitioner

## 2023-08-31 ENCOUNTER — Encounter: Payer: Self-pay | Admitting: Nurse Practitioner

## 2023-08-31 VITALS — BP 118/74 | HR 70 | Ht 59.5 in | Wt 116.4 lb

## 2023-08-31 DIAGNOSIS — K225 Diverticulum of esophagus, acquired: Secondary | ICD-10-CM | POA: Diagnosis not present

## 2023-08-31 DIAGNOSIS — Z860100 Personal history of colon polyps, unspecified: Secondary | ICD-10-CM | POA: Diagnosis not present

## 2023-08-31 DIAGNOSIS — R933 Abnormal findings on diagnostic imaging of other parts of digestive tract: Secondary | ICD-10-CM

## 2023-08-31 DIAGNOSIS — K222 Esophageal obstruction: Secondary | ICD-10-CM | POA: Diagnosis not present

## 2023-08-31 DIAGNOSIS — R1310 Dysphagia, unspecified: Secondary | ICD-10-CM

## 2023-08-31 MED ORDER — PANTOPRAZOLE SODIUM 40 MG PO TBEC: 40.0000 mg | DELAYED_RELEASE_TABLET | Freq: Every day | ORAL | 3 refills | Status: DC

## 2023-08-31 NOTE — Patient Instructions (Addendum)
You have been scheduled for an endoscopy. Please follow written instructions given to you at your visit today.  If you use inhalers (even only as needed), please bring them with you on the day of your procedure.  If you take any of the following medications, they will need to be adjusted prior to your procedure:   DO NOT TAKE 7 DAYS PRIOR TO TEST- Trulicity (dulaglutide) Ozempic, Wegovy (semaglutide) Mounjaro (tirzepatide) Bydureon Bcise (exanatide extended release)  DO NOT TAKE 1 DAY PRIOR TO YOUR TEST Rybelsus (semaglutide) Adlyxin (lixisenatide) Victoza (liraglutide) Byetta (exanatide)  Continue taking Pantoprazole 30-60 minutes before breakfast. If your blood pressure at your visit was 140/90 or greater, please contact your primary care physician to follow up on this.  _______________________________________________________  If you are age 38 or older, your body mass index should be between 23-30. Your Body mass index is 23.11 kg/m. If this is out of the aforementioned range listed, please consider follow up with your Primary Care Provider.  If you are age 63 or younger, your body mass index should be between 19-25. Your Body mass index is 23.11 kg/m. If this is out of the aformentioned range listed, please consider follow up with your Primary Care Provider.   ________________________________________________________  The [facility] providers would like to encourage you to use [facility] to communicate with providers for non-urgent requests or questions.  Due to long hold times on the telephone, sending your provider a message by [facility] may be a faster and more efficient way to get a response.  Please allow 48 business hours for a response.  Please remember that this is for non-urgent requests.  _______________________________________________________  Due to recent changes in healthcare laws, you may see the results of your imaging and laboratory studies on MyChart before your  provider has had a chance to review them.  We understand that in some cases there may be results that are confusing or concerning to you. Not all laboratory results come back in the same time frame and the provider may be waiting for multiple results in order to interpret others.  Please give [place] 48 hours in order for your provider to thoroughly review all the results before contacting the office for clarification of your results.    Thank you for entrusting me with your care and choosing [facility].  Willette Cluster NP

## 2023-08-31 NOTE — Progress Notes (Signed)
Brief Narrative 72 y.o. yo female with a past medical history not limited to  Patient previously followed by Dr. Kinnie Scales. Dr. Chales Abrahams accepted transfer of care. Patient referred by PCP for dysphagia and abnormal barium swallow. .   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   History colon polyps.  No polyps on last colonoscopy June 2022 but prep was poor  See PMH for any additional medical history   PLAN    --Schedule for EGD. The risks and benefits of EGD with possible biopsies and dilation were discussed with the patient who agrees to proceed.  --Continue daily Pantoprazole 40 mg daily before breakfast for now. Can decide after EGD if Pantoprazole is warranted ( no GERD history) --5 year surveillance colonoscopy due June 2027  HPI   Chief complaint : problems swallowing   Brief GI History:  Patient doesn't know how long she has had intermittent solid food dysphagia but thinks it  got worse sometime in July so she went to [facility].  Seen at [facility] in July. Started on Pantoprazole. Barium swallow ordered and was abnormal with an esophageal diverticulum with associated narrowing. Saw PCP in September, referred to GI .   Interval History:  She is able to cough up lodged food almost immediately after swallowing. She cannot correlate dysphagia to any particular food. No problems swallowing fluids. Hasn't seen any improvement on  Pantoprazole. .She has never has never had any symptoms of acid reflux.   GI History / Studies   **May not be a complete list of studies  Barium swallow 04/20/23  1. 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 2. 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  Surveillance colonoscopy June 2022 Poor prep. Diverticulosis. Internal hemorrhoids.  Repeat in 5 years due to poor prep   Labs       Latest Ref Rng & Units 07/28/2023   11:26 AM 01/25/2023   10:49 AM 01/14/2022   10:32 AM  CBC  WBC 3.4 - 10.8 x10E3/uL 4.7  5.9  6.0   Hemoglobin 11.1 - 15.9 g/dL 65.7  84.6  96.2   Hematocrit 34.0 - 46.6 % 39.5  38.8  38.7   Platelets 150 - 450 x10E3/uL 235  231  271     No results found for: "LIPASE"    Latest Ref Rng & Units 07/28/2023   11:26 AM 01/25/2023   10:49 AM 07/16/2022   11:26 AM  CMP  Glucose 70 - 99 mg/dL 90  89  90   BUN 8 - 27 mg/dL 11  9    Creatinine 9.52 - 1.00 mg/dL 8.41  3.24    Sodium 401 - 144 mmol/L 142  140    Potassium 3.5 - 5.2 mmol/L 4.3  3.4    Chloride 96 - 106 mmol/L 104  99    CO2 20 - 29 mmol/L 25  26    Calcium 8.7 - 10.3 mg/dL 9.9  9.7    Total Protein 6.0 - 8.5 g/dL 7.1  7.0    Total Bilirubin 0.0 - 1.2 mg/dL 0.3  0.4    Alkaline Phos 44 - 121 IU/L 66  79    AST 0 - 40 IU/L 15  16    ALT 0 - 32 IU/L 9  13          MM 3D SCREENING MAMMOGRAM BILATERAL BREAST  CLINICAL DATA:  Screening. /  EXAM: DIGITAL SCREENING BILATERAL MAMMOGRAM WITH TOMOSYNTHESIS AND CAD  TECHNIQUE: Bilateral screening digital craniocaudal and mediolateral oblique mammograms were obtained. Bilateral screening digital breast tomosynthesis was performed. The images were evaluated with computer-aided detection.  COMPARISON:  Previous exam(s).  ACR Breast Density Category b: There are scattered areas of fibroglandular density.  FINDINGS: There are no findings suspicious for malignancy.  IMPRESSION: No mammographic evidence of malignancy. A result letter of this screening mammogram will be mailed directly to the patient.  RECOMMENDATION: Screening mammogram in one year. (Code:SM-B-01Y)  BI-RADS CATEGORY  1: Negative.  Electronically Signed   By: Meda Klinefelter M.D.   On: 05/27/2023 10:59    Past Medical History:  Diagnosis Date   COPD (chronic obstructive pulmonary disease) (HCC)    Dietary surveillance and counseling    Diverticulosis 2012    Glaucoma    Dr. Charlotte Sanes   Gouty arthropathy    R foot   Hx of adenomatous colonic polyps 2012   Dr. Kinnie Scales   Hyperlipidemia 1997   Hypertension    controlled   Internal hemorrhoids 2012   LBP (low back pain)    Osteopenia 04/2010   (DEXA ordered by Dr. Nicholas Lose, scheduled for 04/2012)   Postmenopausal    Prediabetes    Smoker    Vitamin D deficiency    Past Surgical History:  Procedure Laterality Date   APPENDECTOMY  1966   BAND HEMORRHOIDECTOMY  06/2011   Dr. Kinnie Scales   COLONOSCOPY  03/2011   Dr. Kinnie Scales   TUBAL LIGATION  1980   Family History  Problem Relation Age of Onset   Alzheimer's disease Mother    Hypertension Mother    Stroke Mother 86   Heart disease Father    Diabetes Father    Kidney Stones Sister    Hypertension Brother    Diabetes Brother    Cancer Neg Hx    Breast cancer Neg Hx    Social History   Tobacco Use   Smoking status: Every Day    Current packs/day: 0.25    Average packs/day: 0.3 packs/day for 40.0 years (10.0 ttl pk-yrs)    Types: Cigarettes   Smokeless tobacco: Never   Tobacco comments:    3-5/day, sometimes goes days without smoking, smokes most days  Vaping Use   Vaping status: Never Used  Substance Use Topics   Alcohol use: Yes    Alcohol/week: 0.0 standard drinks of alcohol    Comment: up to 3 drinks per week.   Drug use: No   Current Outpatient Medications  Medication Sig Dispense Refill   allopurinol (ZYLOPRIM) 100 MG tablet TAKE 1 TABLET BY MOUTH DAILY 90 tablet 0   Calcium Carbonate-Vitamin D 600-400 MG-UNIT tablet Take 1 tablet by mouth daily.     cholecalciferol (VITAMIN D) 1000 UNITS tablet Take 1,000 Units by mouth daily.     dorzolamide-timolol (COSOPT) 22.3-6.8 MG/ML ophthalmic solution Place 1 drop into both eyes 2 (two) times daily.     indapamide (LOZOL) 2.5 MG tablet TAKE 1 TABLET BY MOUTH IN THE  MORNING 90 tablet 1   pantoprazole (PROTONIX) 40 MG tablet Take 40 mg by mouth daily.     potassium chloride 20 MEQ/15ML  (10%) SOLN DILUTE 30 ML IN 4 OUNCES OF  WATER OR JUICE AND DRINK 3 TIMES WEEKLY MONDAY WEDNESDAY FRIDAY  AND DO THE SAME WITH 15 ML 4  TIMES WEEKLY ALL OTHER DAYS 2365 mL 3   simvastatin (ZOCOR)  40 MG tablet TAKE 1 TABLET BY MOUTH IN THE  EVENING 90 tablet 0   No current facility-administered medications for this visit.   No Known Allergies   Review of Systems: All systems reviewed and negative except where noted in HPI.   Wt Readings from Last 3 Encounters:  08/31/23 116 lb 6 oz (52.8 kg)  07/28/23 116 lb 9.6 oz (52.9 kg)  06/17/23 116 lb 6.4 oz (52.8 kg)    Physical Exam:  BP 118/74 (BP Location: Left Arm, Patient Position: Sitting, Cuff Size: Normal)   Pulse 70   Ht 4' 11.5" (1.511 m)   Wt 116 lb 6 oz (52.8 kg)   SpO2 98%   BMI 23.11 kg/m  Constitutional:  Pleasant, generally well appearing female in no acute distress. Psychiatric:  Normal mood and affect. Behavior is normal. EENT: Pupils normal.  Conjunctivae are normal. No scleral icterus. Neck supple.  Cardiovascular: Normal rate, regular rhythm.  Pulmonary/chest: Effort normal and breath sounds normal. No wheezing, rales or rhonchi. Abdominal: Soft, nondistended, nontender. Bowel sounds active throughout. There are no masses palpable. No hepatomegaly. Neurological: Alert and oriented to person place and time.   Willette Cluster, NP  08/31/2023, 10:43 AM  Cc:  Referring Provider Joselyn Arrow, MD

## 2023-09-05 NOTE — Progress Notes (Signed)
Agree with assessment/plan.  Raj Florestine Carmical, MD [facility] [phone]  

## 2023-09-07 ENCOUNTER — Ambulatory Visit: Payer: Medicare Other | Admitting: Gastroenterology

## 2023-09-07 ENCOUNTER — Encounter: Payer: Self-pay | Admitting: Gastroenterology

## 2023-09-07 ENCOUNTER — Telehealth: Payer: Self-pay | Admitting: Gastroenterology

## 2023-09-07 VITALS — BP 121/61 | HR 64 | Temp 97.1°F | Resp 20 | Ht 59.0 in | Wt 116.0 lb

## 2023-09-07 DIAGNOSIS — K225 Diverticulum of esophagus, acquired: Secondary | ICD-10-CM

## 2023-09-07 DIAGNOSIS — T18198A Other foreign object in esophagus causing other injury, initial encounter: Secondary | ICD-10-CM

## 2023-09-07 DIAGNOSIS — K222 Esophageal obstruction: Secondary | ICD-10-CM

## 2023-09-07 DIAGNOSIS — R1310 Dysphagia, unspecified: Secondary | ICD-10-CM

## 2023-09-07 MED ORDER — SODIUM CHLORIDE 0.9 % IV SOLN: 500.0000 mL | Freq: Once | INTRAVENOUS | Status: DC

## 2023-09-07 NOTE — Op Note (Signed)
[facility] Patient Name: Bailey Parker Procedure Date: 09/07/2023 10:37 AM MRN: [id number] Endoscopist: Lynann Bologna , MD, [id number] Age: 72 Referring MD:  Date of Birth: 12-Sep-1951 Gender: Female Account #: [id number] Procedure:                Upper GI endoscopy Indications:              Dysphagia with barium swallow showing a large                            diverticulum. Medicines:                Monitored Anesthesia Care Procedure:                Pre-Anesthesia Assessment:                           - Prior to the procedure, a History and Physical                            was performed, and patient medications and                            allergies were reviewed. The patient's tolerance of                            previous anesthesia was also reviewed. The risks                            and benefits of the procedure and the sedation                            options and risks were discussed with the patient.                            All questions were answered, and informed consent                            was obtained. Prior Anticoagulants: The patient has                            taken no anticoagulant or antiplatelet agents. ASA                            Grade Assessment: II - A patient with mild systemic                            disease. After reviewing the risks and benefits,                            the patient was deemed in satisfactory condition to                            undergo the procedure.  After obtaining informed consent, the endoscope was                            passed under direct vision. Throughout the                            procedure, the patient's blood pressure, pulse, and                            oxygen saturations were monitored continuously. The                            GIF [id number] #[id number] was introduced through the                            mouth, with the intention of advancing to the                             esophagus. The scope was advanced to the                            cricopharyngeal esophagus before the procedure was                            aborted. Medications were given. The procedure was                            aborted. The upper GI endoscopy was accomplished                            without difficulty. The patient tolerated the                            procedure well. 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 After clearing it                            endoscopically, a pill was seen in the                            diverticulum. There was cricopharyngeal narrowing                            without stricture. We decided to hold off on EGD                            d/t high risk of aspiration. Complications:            No immediate complications. The patient saturation  did drop down transiently to mid 40s. Procedure was                            aborted. Estimated Blood Loss:     Estimated blood loss: none. Impression:               - Large Zenker's diverticulum. Recommendation:           - Patient has a contact number available for                            emergencies. The signs and symptoms of potential                            delayed complications were discussed with the                            patient. Return to normal activities tomorrow.                            Written discharge instructions were provided to the                            patient.                           -Reviewed barium swallow with the patient's                            daughter. Recommend appointment with Dr. Boyd Kerbs at                            [facility] or Dr. Arlie Solomons at [facility] for possible zPOEM                            and further evaluation.                           - Resume previous diet. Please crush pills                            - The findings and recommendations were discussed                            with the patient's family. Lynann Bologna, MD 09/07/2023 11:02:40 AM This report has been signed electronically.

## 2023-09-07 NOTE — Telephone Encounter (Signed)
Tried to make an appointment directly with Dr Orland Mustard. Pt is not in [facility] system and was told that a referral has to be sent in. Referral was faxed along with pt records.  Pt daughter Cicero Duck made aware that the referral has been sent and that their office will contact her and if she has not heard anything within 3 weeks then to give our office a call back.  Pt verbalized understanding with all questions answered.

## 2023-09-07 NOTE — Patient Instructions (Signed)
Resume previous diet. Please crush pills.  Dr. Urban Gibson nurse to schedule follow up appointment at [facility]   YOU HAD AN ENDOSCOPIC PROCEDURE TODAY AT THE [facility]:   Refer to the procedure report that was given to you for any specific questions about what was found during the examination.  If the procedure report does not answer your questions, please call your gastroenterologist to clarify.  If you requested that your care partner not be given the details of your procedure findings, then the procedure report has been included in a sealed envelope for you to review at your convenience later.  YOU SHOULD EXPECT: Some feelings of bloating in the abdomen. Passage of more gas than usual.  Walking can help get rid of the air that was put into your GI tract during the procedure and reduce the bloating. If you had a lower endoscopy (such as a colonoscopy or flexible sigmoidoscopy) you may notice spotting of blood in your stool or on the toilet paper. If you underwent a bowel prep for your procedure, you may not have a normal bowel movement for a few days.  Please Note:  You might notice some irritation and congestion in your nose or some drainage.  This is from the oxygen used during your procedure.  There is no need for concern and it should clear up in a day or so.  SYMPTOMS TO REPORT IMMEDIATELY:  Following upper endoscopy (EGD)  Vomiting of blood or coffee ground material  New chest pain or pain under the shoulder blades  Painful or persistently difficult swallowing  New shortness of breath  Fever of 100F or higher  Black, tarry-looking stools  For urgent or emergent issues, a gastroenterologist can be reached at any hour by calling [phone]. Do not use MyChart messaging for urgent concerns.    DIET:  We do recommend a small meal at first, but then you may proceed to your regular diet.  Drink plenty of fluids but you should avoid alcoholic beverages for 24  hours.  ACTIVITY:  You should plan to take it easy for the rest of today and you should NOT DRIVE or use heavy machinery until tomorrow (because of the sedation medicines used during the test).    FOLLOW UP: Our staff will call the number listed on your records the next business day following your procedure.  We will call around 7:15- 8:00 am to check on you and address any questions or concerns that you may have regarding the information given to you following your procedure. If we do not reach you, we will leave a message.     If any biopsies were taken you will be contacted by phone or by letter within the next 1-3 weeks.  Please call us at [phone] if you have not heard about the biopsies in 3 weeks.    SIGNATURES/CONFIDENTIALITY: You and/or your care partner have signed paperwork which will be entered into your electronic medical record.  These signatures attest to the fact that that the information above on your After Visit Summary has been reviewed and is understood.  Full responsibility of the confidentiality of this discharge information lies with you and/or your care-partner.

## 2023-09-07 NOTE — Telephone Encounter (Signed)
Got message from Dr. Boyd Kerbs-  no one in [facility] is doing zPOEM   [address] make appt with Dr Orland Mustard [phone] for Zenker's diverticulum and inform pt'd daughter. RG

## 2023-09-07 NOTE — Progress Notes (Unsigned)
To pacu, VSS. Report to [email or web address] 

## 2023-09-07 NOTE — Progress Notes (Signed)
Pt's states no medical or surgical changes since previsit or office visit. 

## 2023-09-07 NOTE — Progress Notes (Unsigned)
Brief Narrative 72 y.o. yo female with a past medical history not limited to  Patient previously followed by Dr. Kinnie Scales. Dr. Chales Abrahams accepted transfer of care. Patient referred by PCP for dysphagia and abnormal barium swallow. .    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    History colon polyps.  No polyps on last colonoscopy June 2022 but prep was poor   See PMH for any additional medical history     PLAN    --Schedule for EGD. The risks and benefits of EGD with possible biopsies and dilation were discussed with the patient who agrees to proceed.  --Continue daily Pantoprazole 40 mg daily before breakfast for now. Can decide after EGD if Pantoprazole is warranted ( no GERD history) --5 year surveillance colonoscopy due June 2027   HPI    Chief complaint : problems swallowing    Brief GI History:   Patient doesn't know how long she has had intermittent solid food dysphagia but thinks it  got worse sometime in July so she went to [facility].  Seen at [facility] in July. Started on Pantoprazole. Barium swallow ordered and was abnormal with an esophageal diverticulum with associated narrowing. Saw PCP in September, referred to GI .    Interval History:  She is able to cough up lodged food almost immediately after swallowing. She cannot correlate dysphagia to any particular food. No problems swallowing fluids. Hasn't seen any improvement on  Pantoprazole. .She has never has never had any symptoms of acid reflux.    GI History / Studies    **May not be a complete list of studies   Barium swallow 04/20/23  1. 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 2. 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   Surveillance colonoscopy June 2022 Poor prep. Diverticulosis. Internal hemorrhoids.  Repeat in 5 years due to poor prep      Labs        Latest Ref Rng & Units 07/28/2023   11:26 AM 01/25/2023   10:49 AM 01/14/2022   10:32 AM  CBC  WBC 3.4 - 10.8 x10E3/uL 4.7  5.9  6.0   Hemoglobin 11.1 - 15.9 g/dL 96.0  45.4  09.8   Hematocrit 34.0 - 46.6 % 39.5  38.8  38.7   Platelets 150 - 450 x10E3/uL 235  231  271       Recent Labs  No results found for: "LIPASE"       Latest Ref Rng & Units 07/28/2023   11:26 AM 01/25/2023   10:49 AM 07/16/2022   11:26 AM  CMP  Glucose 70 - 99 mg/dL 90  89  90   BUN 8 - 27 mg/dL 11  9     Creatinine 1.19 - 1.00 mg/dL 1.47  8.29     Sodium 562 - 144 mmol/L 142  140     Potassium 3.5 - 5.2 mmol/L 4.3  3.4     Chloride 96 - 106 mmol/L 104  99     CO2 20 - 29 mmol/L 25  26     Calcium 8.7 - 10.3 mg/dL 9.9  9.7     Total Protein 6.0 - 8.5 g/dL 7.1  7.0     Total Bilirubin 0.0 - 1.2 mg/dL 0.3  0.4     Alkaline Phos 44 - 121 IU/L 66  79     AST 0 - 40 IU/L 15  16     ALT 0 - 32 IU/L 9  13               MM 3D SCREENING MAMMOGRAM BILATERAL BREAST CLINICAL DATA:  Screening. /   EXAM: DIGITAL SCREENING BILATERAL MAMMOGRAM WITH TOMOSYNTHESIS AND CAD   TECHNIQUE: Bilateral screening digital craniocaudal and mediolateral oblique mammograms were obtained. Bilateral screening digital breast tomosynthesis was performed. The images were evaluated with computer-aided detection.   COMPARISON:  Previous exam(s).   ACR Breast Density Category b: There are scattered areas of fibroglandular density.   FINDINGS: There are no findings suspicious for malignancy.   IMPRESSION: No mammographic evidence of malignancy. A result letter of this screening mammogram will be mailed directly to the patient.   RECOMMENDATION: Screening mammogram in one year. (Code:SM-B-01Y)   BI-RADS CATEGORY  1: Negative.   Electronically Signed   By: Meda Klinefelter M.D.   On: 05/27/2023 10:59           Past Medical History:  Diagnosis Date   COPD (chronic obstructive pulmonary disease) (HCC)      Dietary surveillance and counseling     Diverticulosis 2012   Glaucoma      Dr. Charlotte Sanes   Gouty arthropathy      R foot   Hx of adenomatous colonic polyps 2012    Dr. Kinnie Scales   Hyperlipidemia 1997   Hypertension      controlled   Internal hemorrhoids 2012   LBP (low back pain)     Osteopenia 04/2010    (DEXA ordered by Dr. Nicholas Lose, scheduled for 04/2012)   Postmenopausal     Prediabetes     Smoker     Vitamin D deficiency               Past Surgical History:  Procedure Laterality Date   APPENDECTOMY   1966   BAND HEMORRHOIDECTOMY   06/2011    Dr. Kinnie Scales   COLONOSCOPY   03/2011    Dr. Kinnie Scales   TUBAL LIGATION   1980             Family History  Problem Relation Age of Onset   Alzheimer's disease Mother     Hypertension Mother     Stroke Mother 85   Heart disease Father     Diabetes Father     Kidney Stones Sister     Hypertension Brother     Diabetes Brother     Cancer Neg Hx     Breast cancer Neg Hx          Social History  Social History         Tobacco Use   Smoking status: Every Day      Current packs/day: 0.25      Average packs/day: 0.3 packs/day for 40.0 years (10.0 ttl pk-yrs)      Types: Cigarettes   Smokeless tobacco: Never   Tobacco comments:      3-5/day, sometimes goes days without smoking, smokes most days  Vaping Use   Vaping status: Never Used  Substance Use Topics   Alcohol use: Yes      Alcohol/week: 0.0 standard drinks of alcohol      Comment: up to 3 drinks per week.   Drug use: No            Current Outpatient Medications  Medication Sig Dispense Refill   allopurinol (ZYLOPRIM) 100 MG  tablet TAKE 1 TABLET BY MOUTH DAILY 90 tablet 0   Calcium Carbonate-Vitamin D 600-400 MG-UNIT tablet Take 1 tablet by mouth daily.       cholecalciferol (VITAMIN D) 1000 UNITS tablet Take 1,000 Units by mouth daily.       dorzolamide-timolol (COSOPT) 22.3-6.8 MG/ML ophthalmic solution Place 1 drop into both eyes 2 (two) times daily.        indapamide (LOZOL) 2.5 MG tablet TAKE 1 TABLET BY MOUTH IN THE  MORNING 90 tablet 1   pantoprazole (PROTONIX) 40 MG tablet Take 40 mg by mouth daily.       potassium chloride 20 MEQ/15ML (10%) SOLN DILUTE 30 ML IN 4 OUNCES OF  WATER OR JUICE AND DRINK 3 TIMES WEEKLY MONDAY WEDNESDAY FRIDAY  AND DO THE SAME WITH 15 ML 4  TIMES WEEKLY ALL OTHER DAYS 2365 mL 3   simvastatin (ZOCOR) 40 MG tablet TAKE 1 TABLET BY MOUTH IN THE  EVENING 90 tablet 0      No current facility-administered medications for this visit.      Allergies  No Known Allergies       Review of Systems: All systems reviewed and negative except where noted in HPI.       Wt Readings from Last 3 Encounters:  08/31/23 116 lb 6 oz (52.8 kg)  07/28/23 116 lb 9.6 oz (52.9 kg)  06/17/23 116 lb 6.4 oz (52.8 kg)      Physical Exam:  BP 118/74 (BP Location: Left Arm, Patient Position: Sitting, Cuff Size: Normal)   Pulse 70   Ht 4' 11.5" (1.511 m)   Wt 116 lb 6 oz (52.8 kg)   SpO2 98%   BMI 23.11 kg/m  Constitutional:  Pleasant, generally well appearing female in no acute distress. Psychiatric:  Normal mood and affect. Behavior is normal. EENT: Pupils normal.  Conjunctivae are normal. No scleral icterus. Neck supple.  Cardiovascular: Normal rate, regular rhythm.  Pulmonary/chest: Effort normal and breath sounds normal. No wheezing, rales or rhonchi. Abdominal: Soft, nondistended, nontender. Bowel sounds active throughout. There are no masses palpable. No hepatomegaly. Neurological: Alert and oriented to person place and time.     Willette Cluster, NP   Attending physician's note   I have taken history, reviewed the chart and examined the patient. I performed a substantive portion of this encounter, including complete performance of at least one of the key components, in conjunction with the APP. I agree with the Advanced Practitioner's note, impression and recommendations.   Ba swallow reviewed EGD today    Edman Circle, MD [facility] [phone]

## 2023-09-08 ENCOUNTER — Telehealth: Payer: Self-pay | Admitting: *Deleted

## 2023-09-08 NOTE — Telephone Encounter (Signed)
  Follow up Call-     09/07/2023   10:12 AM  Call back number  Post procedure Call Back phone  # [phone]  Permission to leave phone message Yes     Patient questions:  Do you have a fever, pain , or abdominal swelling? No. Pain Score  0 *  Have you tolerated food without any problems? Yes.    Have you been able to return to your normal activities? Yes.    Do you have any questions about your discharge instructions: Diet   No. Medications  No. Follow up visit  No.  Do you have questions or concerns about your Care? No.  Actions: * If pain score is 4 or above: No action needed, pain <4.

## 2023-09-20 ENCOUNTER — Other Ambulatory Visit: Payer: Self-pay | Admitting: Family Medicine

## 2023-09-20 DIAGNOSIS — E7800 Pure hypercholesterolemia, unspecified: Secondary | ICD-10-CM

## 2023-09-20 DIAGNOSIS — E876 Hypokalemia: Secondary | ICD-10-CM

## 2023-09-20 DIAGNOSIS — I10 Essential (primary) hypertension: Secondary | ICD-10-CM

## 2023-09-20 DIAGNOSIS — M109 Gout, unspecified: Secondary | ICD-10-CM

## 2023-09-29 NOTE — Telephone Encounter (Signed)
 Patient called stating that she hasn't heard from the office she was referred to.

## 2023-10-05 NOTE — Telephone Encounter (Signed)
 Contacted Dr Nelwyn Salisbury office (phone [phone]) and spoke to [facility]. Aundra Millet states that referral was received but is still being reviewed by nursing staff at this time.   I have left a voicemail for patient's daughter/caller.

## 2023-10-07 NOTE — Telephone Encounter (Signed)
Spoke with patient & advised her that referral is still under review, and to let us know back in 2 weeks if she has not heard back. From my last conversation with [facility], referrals were now taking 4-6 weeks to review. Advised her of this as well. Pt verbalized all understanding.

## 2024-01-11 ENCOUNTER — Other Ambulatory Visit: Payer: Self-pay | Admitting: Nurse Practitioner

## 2024-01-31 ENCOUNTER — Ambulatory Visit: Payer: Medicare Other | Admitting: Family Medicine

## 2024-02-03 ENCOUNTER — Ambulatory Visit: Payer: Medicare Other | Admitting: Family Medicine

## 2024-02-14 DIAGNOSIS — K225 Diverticulum of esophagus, acquired: Secondary | ICD-10-CM | POA: Insufficient documentation

## 2024-02-14 NOTE — Patient Instructions (Incomplete)
  HEALTH MAINTENANCE RECOMMENDATIONS:  It is recommended that you get at least 30 minutes of aerobic exercise at least 5 days/week (for weight loss, you may need as much as 60-90 minutes). This can be any activity that gets your heart rate up. This can be divided in 10-15 minute intervals if needed, but try and build up your endurance at least once a week.  Weight bearing exercise is also recommended twice weekly.  Eat a healthy diet with lots of vegetables, fruits and fiber.  "Colorful" foods have a lot of vitamins (ie green vegetables, tomatoes, red peppers, etc).  Limit sweet tea, regular sodas and alcoholic beverages, all of which has a lot of calories and sugar.  Up to 1 alcoholic drink daily may be beneficial for women (unless trying to lose weight, watch sugars).  Drink a lot of water.  Calcium recommendations are 1200-1500 mg daily (1500 mg for postmenopausal women or women without ovaries), and vitamin D  1000 IU daily.  This should be obtained from diet and/or supplements (vitamins), and calcium should not be taken all at once, but in divided doses.  Monthly self breast exams and yearly mammograms for women over the age of 52 is recommended.  Sunscreen of at least SPF 30 should be used on all sun-exposed parts of the skin when outside between the hours of 10 am and 4 pm (not just when at beach or pool, but even with exercise, golf, tennis, and yard work!)  Use a sunscreen that says "broad spectrum" so it covers both UVA and UVB rays, and make sure to reapply every 1-2 hours.  Remember to change the batteries in your smoke detectors when changing your clock times in the spring and fall. Carbon monoxide detectors are recommended for your home.  Use your seat belt every time you are in a car, and please drive safely and not be distracted with cell phones and texting while driving.   Bailey Parker , Thank you for taking time to come for your Medicare Wellness Visit. I appreciate your ongoing  commitment to your health goals. Please review the following plan we discussed and let me know if I can assist you in the future.   This is a list of the screening recommended for you and due dates:  Health Maintenance  Topic Date Due   COVID-19 Vaccine (8 - 2024-25 season) 12/15/2023   Flu Shot  04/21/2024   Mammogram  05/25/2024   Medicare Annual Wellness Visit  02/14/2025   DTaP/Tdap/Td vaccine (4 - Td or Tdap) 09/30/2026   Colon Cancer Screening  03/13/2027   Pneumonia Vaccine  Completed   DEXA scan (bone density measurement)  Completed   Hepatitis C Screening  Completed   Zoster (Shingles) Vaccine  Completed   HPV Vaccine  Aged Out   Meningitis B Vaccine  Aged Out   Please get the RSV vaccine from the pharmacy in the Fall. COVID boosters are recommended (every 6 months for >65 years). Yearly high dose flu shots are recommended in the Fall.  I think it is time to set a quit date and really try and intentionally not smoke. Don't buy any more, and consider trying nicotine gum or lozenge if/when you have a craving.

## 2024-02-14 NOTE — Progress Notes (Unsigned)
 No chief complaint on file.  Bailey Parker is a 73 y.o. female who presents for annual wellness visit and follow-up on chronic medical conditions.     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  She was referred to Dr. Norma Beckers at [facility] for possible zPOEM and further evaluation. Appointment is scheduled for 6/4. She was advised to crush pills, as a large amount of food and solid pill was found in the diverticulum. She continues on pantoprazole  40 mg daily.  Hypertension follow-up: She reports compliance with her medications (indapamide ). BP's aren't routinely checked at home anymore.  It is always normal when she does check it.   BP Readings from Last 3 Encounters:  09/07/23 121/61  08/31/23 118/74  07/28/23 130/84     Denies dizziness, headaches, chest pain, edema, muscle cramps. Denies side effects of medications.     H/o hypokalemia--related to diuretic use.  She is compliant with liquid potassium supplement. Last K+ level was normal in 07/2023.  She denies muscle cramps.  Lab Results  Component Value Date   K 4.3 07/28/2023     Hyperlipidemia follow-up: Patient is reportedly following a low-fat, low cholesterol diet. Compliant with medications (simvastatin  40mg ) and denies medication side effects. She has been at goal on this regimen.  Lab Results  Component Value Date   CHOL 145 07/28/2023   HDL 58 07/28/2023   LDLCALC 75 07/28/2023   TRIG 56 07/28/2023   CHOLHDL 2.5 07/28/2023    Gout: Denies any flares in many years, but due to severity of flare, isn't interested in trial off allopurinol  (suggested due to low uric acid levels). She is compliant with low dose allopurinol .   Lab Results  Component Value Date   LABURIC 4.3 07/28/2023    Tobacco use: She continues to smoke. 0-5/day. She has never smoked more than 1/2 PPD, and usually less, for 40 years. Denies shortness of breath or cough.  She would like to quit, but isn't really ready.     H/o pre-diabetes:  She continues to watch her diet (limit sweets, portions). Last check of A1c was normal (5.6 in 10/2018).  Last abnormal was in 2016, highest was A1c 5.8. Fasting sugars have been normal, was 90 in 07/2023. She is doing her husband's Parkinson's workouts with him (10 minutes daily) She isn't using the exercise bike much anymore. Uses handweights daily.    Immunization History  Administered Date(s) Administered   PFIZER(Purple Top)SARS-COV-2 Vaccination 01/10/2020, 01/24/2020, 08/06/2020, 02/05/2021   PNEUMOCOCCAL CONJUGATE-20 02/27/2023   Pfizer Covid-19 Vaccine Bivalent Booster 33yrs & up 07/30/2021   Pfizer(Comirnaty)Fall Seasonal Vaccine 12 years and older 07/16/2022, 06/17/2023   Pneumococcal Conjugate-13 01/22/2014   Pneumococcal Polysaccharide-23 06/19/2015   Td 08/03/1995, 09/30/2016   Tdap 02/02/2006   Zoster Recombinant(Shingrix) 01/06/2022, 04/08/2022   refuses flu shots Last Pap smear: 11/2019--normal; no high risk HPV detected  Last mammogram: 05/2023 Last colonoscopy: 02/2022 (Dr. Vinson Grego hemorrhoids and diverticulosis. 5 year f/u recommended (prior--03/2016; polyps) Last DEXA: 05/2017 T-1.1 (stable from prior DEXA elsewhere).  Dentist: 2x/year, cleanings 4x/year Ophtho: twice yearly   Exercise:  She is doing her husband's Parkinson's workouts with him (10 minutes daily) Uses handweights daily. Exercise bike? ***    Patient Care Team: Roosvelt Colla, MD as PCP - General (Family Medicine) Serafin Dames, MD as Consulting Physician (Gastroenterology) Dema Filler, MD as Consulting Physician (Ophthalmology) Dentist: Dr. Ramiro Burly GI: Dr. Venice Gillis, Mai Schwalbe   Depression Screening: Flowsheet Row Office Visit from 01/25/2023 in  [facility]  PHQ-2 Total Score 0        Falls screen:     01/25/2023    9:36 AM 01/14/2022    9:43 AM 07/16/2021   10:42 AM 01/08/2021    9:36 AM 06/10/2020   10:51 AM  Fall Risk   Falls in the past  year? 0 0 0 0 0  Number falls in past yr: 0 0 0 0   Injury with Fall? 0 0 0 0   Risk for fall due to : No Fall Risks No Fall Risks No Fall Risks No Fall Risks   Follow up Falls evaluation completed Falls evaluation completed Falls evaluation completed Falls evaluation completed      Functional Status Survey:          End of Life Discussion:  Patient has a living will and medical power of attorney, in chart   PMH, PSH, SH and FH were reviewed and updated.     ROS: The patient denies anorexia, fever, weight changes, headaches, vision changes, decreased hearing, ear pain, sore throat, breast concerns, chest pain, palpitations, dizziness, syncope, dyspnea on exertion, cough, swelling, nausea, vomiting, diarrhea, constipation, abdominal pain, melena, hematochezia, indigestion/heartburn, hematuria, incontinence, dysuria, vaginal bleeding, discharge, odor or itch, genital lesions, joint pains, numbness, tingling, weakness, tremor, suspicious skin lesions, depression, anxiety, abnormal bleeding/bruising, or enlarged lymph nodes.  Dysphagia per HPI    PHYSICAL EXAM:  There were no vitals taken for this visit.  Wt Readings from Last 3 Encounters:  09/07/23 116 lb (52.6 kg)  08/31/23 116 lb 6 oz (52.8 kg)  07/28/23 116 lb 9.6 oz (52.9 kg)    General Appearance:   Alert, cooperative, no distress, appears stated age    Head:   Normocephalic, without obvious abnormality, atraumatic    Eyes:   PERRL, conjunctiva/corneas clear, EOM's intact, fundi benign    Ears:   Normal TM's and external ear canals    Nose:   Normal, no drainage  Throat:   Normal, no lesions  Neck:   Supple, no lymphadenopathy; thyroid : no enlargement/ tenderness/nodules; no carotid bruit or JVD.   Back:   Spine nontender, no curvature, ROM normal, no CVA tenderness.   Lungs:   Clear to auscultation bilaterally without wheezes, rales or ronchi; respirations unlabored    Chest Wall:   No tenderness or deformity    Heart:   Regular rate and rhythm, S1 and S2 normal, no murmur, rub or gallop.   Breast Exam:   No tenderness, masses, or nipple discharge or inversion. No axillary lymphadenopathy. Large, pendulous breasts    Abdomen:   Soft, non-tender, nondistended, normoactive bowel sounds, no masses, no hepatosplenomegaly. Diastasis recti, unchanged, nontender   Genitalia:   Normal external genitalia without lesions. BUS and vagina normal; no cervical motion tenderness. No abnormal vaginal discharge. Uterus and adnexa not enlarged, nontender, no masses. Pap not performed. ***  Rectal:   Normal tone, no masses or tenderness; guaiac negative stool  ***  Extremities:   No clubbing, cyanosis or edema    Pulses:   2+ and symmetric all extremities    Skin:   Skin color, texture, turgor normal, no rashes or lesions    Lymph nodes:   Cervical, supraclavicular, inguinal and axillary nodes normal    Neurologic:   Normal strength, sensation and gait; reflexes 2+ and symmetric throughout  Psych:   Normal mood, affect, hygiene and grooming   ASSESSMENT/PLAN:   Can skip pelvic/rectal if no symptoms. Dexa--this Fall with next mammo, or next year?  Her preference   Discussed monthly self breast exams and yearly mammograms; at least 30 minutes of aerobic activity at least 5 days/week and weight-bearing exercise 2x/week; proper sunscreen use reviewed; healthy diet, including goals of calcium and vitamin D  intake and alcohol recommendations (less than or equal to 1 drink/day) reviewed, smoking cessation; regular seatbelt use; changing batteries in smoke detectors.  Immunization recommendations discussed. High dose flu shots are recommended yearly--she declines.  COVID boosters recommended. RSV to get from pharmacy in the fall. Colonoscopy recommendations reviewed, UTD No further pap smears are indicated DEXA discussed, consider in the next year or few years.   MOST form reviewed, Full Code, Full  Care   F/u 6 mos   Medicare Attestation I have personally reviewed: The patient's medical and social history Their use of alcohol, tobacco or illicit drugs Their current medications and supplements The patient's functional ability including ADLs,fall risks, home safety risks, cognitive, and hearing and visual impairment Diet and physical activities Evidence for depression or mood disorders  The patient's weight, height, BMI have been recorded in the chart.  I have made referrals, counseling, and provided education to the patient based on review of the above and I have provided the patient with a written personalized care plan for preventive services.

## 2024-02-15 ENCOUNTER — Ambulatory Visit (INDEPENDENT_AMBULATORY_CARE_PROVIDER_SITE_OTHER): Payer: Medicare Other | Admitting: Family Medicine

## 2024-02-15 ENCOUNTER — Encounter: Payer: Self-pay | Admitting: Family Medicine

## 2024-02-15 VITALS — BP 130/70 | HR 80 | Ht 60.0 in | Wt 116.2 lb

## 2024-02-15 DIAGNOSIS — M8580 Other specified disorders of bone density and structure, unspecified site: Secondary | ICD-10-CM | POA: Diagnosis not present

## 2024-02-15 DIAGNOSIS — M109 Gout, unspecified: Secondary | ICD-10-CM | POA: Diagnosis not present

## 2024-02-15 DIAGNOSIS — Z0000 Encounter for general adult medical examination without abnormal findings: Secondary | ICD-10-CM | POA: Diagnosis not present

## 2024-02-15 DIAGNOSIS — I10 Essential (primary) hypertension: Secondary | ICD-10-CM

## 2024-02-15 DIAGNOSIS — F17200 Nicotine dependence, unspecified, uncomplicated: Secondary | ICD-10-CM

## 2024-02-15 DIAGNOSIS — K225 Diverticulum of esophagus, acquired: Secondary | ICD-10-CM

## 2024-02-15 DIAGNOSIS — Z780 Asymptomatic menopausal state: Secondary | ICD-10-CM

## 2024-02-15 DIAGNOSIS — E7800 Pure hypercholesterolemia, unspecified: Secondary | ICD-10-CM | POA: Diagnosis not present

## 2024-02-15 DIAGNOSIS — R7303 Prediabetes: Secondary | ICD-10-CM

## 2024-02-23 DIAGNOSIS — K225 Diverticulum of esophagus, acquired: Secondary | ICD-10-CM | POA: Diagnosis not present

## 2024-02-29 DIAGNOSIS — H401131 Primary open-angle glaucoma, bilateral, mild stage: Secondary | ICD-10-CM | POA: Diagnosis not present

## 2024-03-19 ENCOUNTER — Other Ambulatory Visit: Payer: Self-pay | Admitting: Family Medicine

## 2024-03-19 DIAGNOSIS — E7800 Pure hypercholesterolemia, unspecified: Secondary | ICD-10-CM

## 2024-03-19 DIAGNOSIS — M109 Gout, unspecified: Secondary | ICD-10-CM

## 2024-03-19 DIAGNOSIS — I10 Essential (primary) hypertension: Secondary | ICD-10-CM

## 2024-04-17 ENCOUNTER — Other Ambulatory Visit: Payer: Self-pay | Admitting: Family Medicine

## 2024-04-17 DIAGNOSIS — Z1231 Encounter for screening mammogram for malignant neoplasm of breast: Secondary | ICD-10-CM

## 2024-04-27 ENCOUNTER — Encounter: Payer: Self-pay | Admitting: Family Medicine

## 2024-04-27 DIAGNOSIS — I10 Essential (primary) hypertension: Secondary | ICD-10-CM | POA: Diagnosis not present

## 2024-04-27 DIAGNOSIS — K225 Diverticulum of esophagus, acquired: Secondary | ICD-10-CM | POA: Diagnosis not present

## 2024-04-27 DIAGNOSIS — F17210 Nicotine dependence, cigarettes, uncomplicated: Secondary | ICD-10-CM | POA: Diagnosis not present

## 2024-04-27 DIAGNOSIS — Z79899 Other long term (current) drug therapy: Secondary | ICD-10-CM | POA: Diagnosis not present

## 2024-04-27 DIAGNOSIS — Z9049 Acquired absence of other specified parts of digestive tract: Secondary | ICD-10-CM | POA: Diagnosis not present

## 2024-04-27 HISTORY — PX: ZENKER'S DIVERTICULECTOMY: SHX5223

## 2024-05-24 ENCOUNTER — Other Ambulatory Visit: Payer: Self-pay | Admitting: Gastroenterology

## 2024-05-25 ENCOUNTER — Telehealth: Payer: Self-pay | Admitting: Family Medicine

## 2024-05-25 NOTE — Telephone Encounter (Unsigned)
 Copied from CRM [id number]. Topic: General - Other >> May 25, 2024 10:33 AM Donee H wrote: Reason for CRM: Patient stated [facility] no longer do Bone Density testing. Patient is requesting Test be scheduled somewhere else. She states someone from Dr. Pixie office scheduled test for her. Follow up with patient on further instructions please [phone]

## 2024-05-25 NOTE — Telephone Encounter (Signed)
 Called patient and provided her with the number for [facility] Central[facility] [phone].

## 2024-05-30 ENCOUNTER — Ambulatory Visit
Admission: RE | Admit: 2024-05-30 | Discharge: 2024-05-30 | Disposition: A | Discharge status: Home / Self Care | Source: Ambulatory Visit | Attending: Family Medicine | Admitting: Family Medicine

## 2024-05-30 DIAGNOSIS — Z1231 Encounter for screening mammogram for malignant neoplasm of breast: Secondary | ICD-10-CM | POA: Diagnosis not present

## 2024-06-01 DIAGNOSIS — K222 Esophageal obstruction: Secondary | ICD-10-CM | POA: Diagnosis not present

## 2024-06-01 DIAGNOSIS — I10 Essential (primary) hypertension: Secondary | ICD-10-CM | POA: Diagnosis not present

## 2024-06-01 DIAGNOSIS — F17210 Nicotine dependence, cigarettes, uncomplicated: Secondary | ICD-10-CM | POA: Diagnosis not present

## 2024-06-01 DIAGNOSIS — Z79899 Other long term (current) drug therapy: Secondary | ICD-10-CM | POA: Diagnosis not present

## 2024-06-01 DIAGNOSIS — K3189 Other diseases of stomach and duodenum: Secondary | ICD-10-CM | POA: Diagnosis not present

## 2024-06-20 ENCOUNTER — Other Ambulatory Visit: Payer: Self-pay | Admitting: Family Medicine

## 2024-06-20 DIAGNOSIS — E876 Hypokalemia: Secondary | ICD-10-CM

## 2024-06-22 DIAGNOSIS — E785 Hyperlipidemia, unspecified: Secondary | ICD-10-CM | POA: Diagnosis not present

## 2024-06-22 DIAGNOSIS — K2289 Other specified disease of esophagus: Secondary | ICD-10-CM | POA: Diagnosis not present

## 2024-06-22 DIAGNOSIS — Z79899 Other long term (current) drug therapy: Secondary | ICD-10-CM | POA: Diagnosis not present

## 2024-06-22 DIAGNOSIS — I10 Essential (primary) hypertension: Secondary | ICD-10-CM | POA: Diagnosis not present

## 2024-06-22 DIAGNOSIS — F17210 Nicotine dependence, cigarettes, uncomplicated: Secondary | ICD-10-CM | POA: Diagnosis not present

## 2024-06-22 DIAGNOSIS — K222 Esophageal obstruction: Secondary | ICD-10-CM | POA: Diagnosis not present

## 2024-07-12 ENCOUNTER — Telehealth: Payer: Self-pay | Admitting: *Deleted

## 2024-07-12 DIAGNOSIS — E7800 Pure hypercholesterolemia, unspecified: Secondary | ICD-10-CM

## 2024-07-12 DIAGNOSIS — I10 Essential (primary) hypertension: Secondary | ICD-10-CM

## 2024-07-12 DIAGNOSIS — Z5181 Encounter for therapeutic drug level monitoring: Secondary | ICD-10-CM

## 2024-07-12 DIAGNOSIS — E876 Hypokalemia: Secondary | ICD-10-CM

## 2024-07-12 DIAGNOSIS — M109 Gout, unspecified: Secondary | ICD-10-CM

## 2024-07-12 NOTE — Telephone Encounter (Signed)
 Called patient and scheduled her for 07/17/24

## 2024-07-12 NOTE — Telephone Encounter (Signed)
 Copied from CRM [id number]. Topic: Clinical - Request for Lab/Test Order >> Jul 12, 2024  2:33 PM Charlet HERO wrote: Reason for CRM: Patient is calling to get labs for her appt on 10/30 did not see a order for the labs. Please call patient back when the lab orders are in.  If she needs fasting labs I can put in and schedule her a visit.

## 2024-07-17 ENCOUNTER — Other Ambulatory Visit

## 2024-07-17 DIAGNOSIS — E7800 Pure hypercholesterolemia, unspecified: Secondary | ICD-10-CM | POA: Diagnosis not present

## 2024-07-17 DIAGNOSIS — E876 Hypokalemia: Secondary | ICD-10-CM | POA: Diagnosis not present

## 2024-07-17 DIAGNOSIS — Z5181 Encounter for therapeutic drug level monitoring: Secondary | ICD-10-CM | POA: Diagnosis not present

## 2024-07-17 DIAGNOSIS — M109 Gout, unspecified: Secondary | ICD-10-CM

## 2024-07-17 DIAGNOSIS — I10 Essential (primary) hypertension: Secondary | ICD-10-CM

## 2024-07-17 LAB — LIPID PANEL

## 2024-07-18 LAB — COMPREHENSIVE METABOLIC PANEL WITH GFR
ALT: 11 IU/L (ref 0–32)
AST: 18 IU/L (ref 0–40)
Albumin: 4.1 g/dL (ref 3.8–4.8)
Alkaline Phosphatase: 68 IU/L (ref 49–135)
BUN/Creatinine Ratio: 13 (ref 12–28)
BUN: 11 mg/dL (ref 8–27)
Bilirubin Total: 0.4 mg/dL (ref 0.0–1.2)
CO2: 23 mmol/L (ref 20–29)
Calcium: 9.8 mg/dL (ref 8.7–10.3)
Chloride: 103 mmol/L (ref 96–106)
Creatinine, Ser: 0.85 mg/dL (ref 0.57–1.00)
Globulin, Total: 2.9 g/dL (ref 1.5–4.5)
Glucose: 90 mg/dL (ref 70–99)
Potassium: 3.9 mmol/L (ref 3.5–5.2)
Sodium: 142 mmol/L (ref 134–144)
Total Protein: 7 g/dL (ref 6.0–8.5)
eGFR: 71 mL/min/1.73 (ref >59)

## 2024-07-18 LAB — LIPID PANEL
Cholesterol, Total: 160 mg/dL (ref 100–199)
HDL: 58 mg/dL (ref >39)
LDL CALC COMMENT:: 2.8 ratio (ref 0.0–4.4)
LDL Chol Calc (NIH): 86 mg/dL (ref 0–99)
Triglycerides: 88 mg/dL (ref 0–149)
VLDL Cholesterol Cal: 16 mg/dL (ref 5–40)

## 2024-07-18 LAB — CBC WITH DIFFERENTIAL/PLATELET
Basophils Absolute: 0.1 x10E3/uL (ref 0.0–0.2)
Basos: 1 %
EOS (ABSOLUTE): 0 x10E3/uL (ref 0.0–0.4)
Eos: 0 %
Hematocrit: 38.9 % (ref 34.0–46.6)
Hemoglobin: 12.6 g/dL (ref 11.1–15.9)
Immature Grans (Abs): 0 x10E3/uL (ref 0.0–0.1)
Immature Granulocytes: 0 %
Lymphocytes Absolute: 1.3 x10E3/uL (ref 0.7–3.1)
Lymphs: 28 %
MCH: 31.5 pg (ref 26.6–33.0)
MCHC: 32.4 g/dL (ref 31.5–35.7)
MCV: 97 fL (ref 79–97)
Monocytes Absolute: 0.4 x10E3/uL (ref 0.1–0.9)
Monocytes: 9 %
Neutrophils Absolute: 2.9 x10E3/uL (ref 1.4–7.0)
Neutrophils: 62 %
Platelets: 238 x10E3/uL (ref 150–450)
RBC: 4 x10E6/uL (ref 3.77–5.28)
RDW: 12.3 % (ref 11.7–15.4)
WBC: 4.6 x10E3/uL (ref 3.4–10.8)

## 2024-07-18 LAB — URIC ACID: Uric Acid: 4.2 mg/dL (ref 3.1–7.9)

## 2024-07-19 NOTE — Patient Instructions (Incomplete)
  Please check with your gastroenterologist (when you see them for upcoming endoscopy) about whether or not you should continue to take pantoprazole  40 mg once daily. This was refilled in September for another 6 months--but we should verify that you truly need to continue to take it, based on their findings.

## 2024-07-19 NOTE — Progress Notes (Unsigned)
 No chief complaint on file.  Patient presents for follow-up on chronic problems. See below for labs done prior to visit.  Since her last visit, she underwent Zenker's diverticulectomy--upper endoscopy with cricopharyngeal myotomy 04/27/24 at [facility]. EGD at [facility] 06/22/24-mod stenosis, passage of scope caused mucosal rent so no dilation done. They recommended repeat EGD 1 mo--scheduled for 07/27/24 at [facility].  ***dysphagia???  She has been treated with pantoprazole  since about 03/2023.  Unclear if this is truly needed, has been refilled by [facility]. She has no h/o heartburn/reflux.  Hypertension follow-up: She reports compliance with her medications (indapamide ). BP's aren't routinely checked at home anymore.  It is always normal when she does check it.  Denies dizziness, headaches, chest pain, edema, muscle cramps. Denies side effects of medications.    BP Readings from Last 3 Encounters:  02/15/24 130/70  09/07/23 121/61  08/31/23 118/74    H/o hypokalemia--related to diuretic use.  She is compliant with liquid potassium supplement.  She denies muscle cramps.      Hyperlipidemia follow-up: Patient is reportedly following a low-fat, low cholesterol diet. Compliant with medications (simvastatin  40mg ) and denies medication side effects.  She has been at goal on this regimen.    Gout: Denies any flares in many years, but due to severity of flare, isn't interested in trial off allopurinol  (suggested due to low uric acid levels). She is compliant with low dose allopurinol .     Tobacco use: She continues to smoke. 0-5/day. She has never smoked more than 1/2 PPD, and usually less, for 40 years. Denies shortness of breath or cough.  She would like to quit, but isn't really sure if she is ready.  She really hasn't ever tried. She goes 3 days without smoking before dental visits without a problem.  Family doesn't really know she smokes. ***UPDATE   H/o pre-diabetes:  She continues to watch her  diet (limit sweets, portions). Last check of A1c was normal (5.6 in 10/2018).  Last abnormal was in 2016, highest was A1c 5.8. Fasting sugars have been normal.  She is doing her husband's Parkinson's workouts with him occasionally (not as regularly). She has been dancing (to a 3 minute long song from line dancing, sometimes listens to the song on repeat (6 minutes)). Uses handweights 3x/week when she walks in place.      PMH, PSH, SH reviewed   ROS: no f/c, URI symptoms, cough, shortness of breath. +PND/throat-clearing.   +dysphagia, sporadic,  improved. ***  No nausea, vomiting, heartburn, bowel changes.  No urinary complaints. No HA, dizziness, chest pain, edema or muscle cramps.    PHYSICAL EXAM:  There were no vitals taken for this visit.  Wt Readings from Last 3 Encounters:  02/15/24 116 lb 3.2 oz (52.7 kg)  09/07/23 116 lb (52.6 kg)  08/31/23 116 lb 6 oz (52.8 kg)   Thin, pleasant female in no distress. Occasional throat-clearing noted. *** HEENT: conjunctiva and sclera are clear, EOMI. OP clear, sinuses nontender Neck: no lymphadenopathy or mass Heart: regular rate and rhythm, no murmur Lungs: clear bilaterally Back: no spinal or CVA tenderness Abdomen: normal bowel sounds, soft, nontender, no organomegaly or mass Extremities: no edema Neuro: alert and oriented, cranial nerves grossly intact. Normal gait Psych: normal mood, affect, hygiene and grooming Skin: normal turgor, no visible rash    Lab Results  Component Value Date   WBC 4.6 07/17/2024   HGB 12.6 07/17/2024   HCT 38.9 07/17/2024   MCV 97 07/17/2024   PLT  238 07/17/2024   Lab Results  Component Value Date   LABURIC 4.2 07/17/2024   Fasting glu 90    Chemistry      Component Value Date/Time   NA 142 07/17/2024 0925   K 3.9 07/17/2024 0925   CL 103 07/17/2024 0925   CO2 23 07/17/2024 0925   BUN 11 07/17/2024 0925   CREATININE 0.85 07/17/2024 0925   CREATININE 0.66 09/30/2017 0916       Component Value Date/Time   CALCIUM 9.8 07/17/2024 0925   ALKPHOS 68 07/17/2024 0925   AST 18 07/17/2024 0925   ALT 11 07/17/2024 0925   BILITOT 0.4 07/17/2024 0925     Lab Results  Component Value Date   CHOL 160 07/17/2024   HDL 58 07/17/2024   LDLCALC 86 07/17/2024   TRIG 88 07/17/2024   CHOLHDL 2.8 07/17/2024     ASSESSMENT/PLAN:   You paired the flu shot with need for COVID booster--I deleted that dx Then realized that she never takes flu shots. You probably meant to order COVID shot with that dx... so deleted the flu shot also Sorry!  COVID booster today, if willing. Flu was already declined.  [facility] REFILLED ALL OF HER MEDS FOR A YEAR in June (???) Except potassium, recently filled, not needed now.   F/u in 01/2025 for AWV as scheduled.  Please check with your gastroenterologist (when you see them for upcoming endoscopy) about whether or not you should continue to take pantoprazole  40 mg once daily. This was refilled in September for another 6 months--but we should verify that you truly need to continue to take it, based on their findings.

## 2024-07-20 ENCOUNTER — Ambulatory Visit (INDEPENDENT_AMBULATORY_CARE_PROVIDER_SITE_OTHER): Admitting: Family Medicine

## 2024-07-20 ENCOUNTER — Encounter: Payer: Self-pay | Admitting: Family Medicine

## 2024-07-20 VITALS — BP 150/80 | HR 64 | Ht 60.0 in | Wt 118.0 lb

## 2024-07-20 DIAGNOSIS — E7800 Pure hypercholesterolemia, unspecified: Secondary | ICD-10-CM

## 2024-07-20 DIAGNOSIS — Z6379 Other stressful life events affecting family and household: Secondary | ICD-10-CM | POA: Diagnosis not present

## 2024-07-20 DIAGNOSIS — M109 Gout, unspecified: Secondary | ICD-10-CM | POA: Diagnosis not present

## 2024-07-20 DIAGNOSIS — Z23 Encounter for immunization: Secondary | ICD-10-CM

## 2024-07-20 DIAGNOSIS — I10 Essential (primary) hypertension: Secondary | ICD-10-CM | POA: Diagnosis not present

## 2024-07-20 DIAGNOSIS — E876 Hypokalemia: Secondary | ICD-10-CM | POA: Diagnosis not present

## 2024-07-27 DIAGNOSIS — I10 Essential (primary) hypertension: Secondary | ICD-10-CM | POA: Diagnosis not present

## 2024-07-27 DIAGNOSIS — Q394 Esophageal web: Secondary | ICD-10-CM | POA: Diagnosis not present

## 2024-07-27 DIAGNOSIS — Z79899 Other long term (current) drug therapy: Secondary | ICD-10-CM | POA: Diagnosis not present

## 2024-07-27 DIAGNOSIS — F17210 Nicotine dependence, cigarettes, uncomplicated: Secondary | ICD-10-CM | POA: Diagnosis not present

## 2024-07-27 DIAGNOSIS — E785 Hyperlipidemia, unspecified: Secondary | ICD-10-CM | POA: Diagnosis not present

## 2024-07-27 DIAGNOSIS — K222 Esophageal obstruction: Secondary | ICD-10-CM | POA: Diagnosis not present

## 2024-07-27 DIAGNOSIS — Z978 Presence of other specified devices: Secondary | ICD-10-CM | POA: Diagnosis not present

## 2024-08-03 DIAGNOSIS — K3189 Other diseases of stomach and duodenum: Secondary | ICD-10-CM | POA: Diagnosis not present

## 2024-09-05 DIAGNOSIS — H2513 Age-related nuclear cataract, bilateral: Secondary | ICD-10-CM | POA: Diagnosis not present

## 2024-09-05 DIAGNOSIS — H5213 Myopia, bilateral: Secondary | ICD-10-CM | POA: Diagnosis not present

## 2024-09-05 DIAGNOSIS — H401131 Primary open-angle glaucoma, bilateral, mild stage: Secondary | ICD-10-CM | POA: Diagnosis not present

## 2025-01-04 ENCOUNTER — Ambulatory Visit (HOSPITAL_BASED_OUTPATIENT_CLINIC_OR_DEPARTMENT_OTHER)

## 2025-02-15 ENCOUNTER — Ambulatory Visit: Payer: Self-pay | Admitting: Family Medicine
